# Patient Record
Sex: Male | Born: 1950 | Race: White | Hispanic: No | Marital: Married | Smoking: Never smoker
Health system: Southern US, Community
[De-identification: ages and names within clinical notes are randomized; demographics above are authoritative.]

## PROBLEM LIST (undated history)

## (undated) DIAGNOSIS — E119 Type 2 diabetes mellitus without complications: Secondary | ICD-10-CM

## (undated) DIAGNOSIS — I209 Angina pectoris, unspecified: Secondary | ICD-10-CM

## (undated) DIAGNOSIS — R112 Nausea with vomiting, unspecified: Secondary | ICD-10-CM

## (undated) DIAGNOSIS — Z9889 Other specified postprocedural states: Secondary | ICD-10-CM

## (undated) DIAGNOSIS — M1990 Unspecified osteoarthritis, unspecified site: Secondary | ICD-10-CM

## (undated) DIAGNOSIS — I10 Essential (primary) hypertension: Secondary | ICD-10-CM

## (undated) DIAGNOSIS — R9431 Abnormal electrocardiogram [ECG] [EKG]: Secondary | ICD-10-CM

## (undated) DIAGNOSIS — M109 Gout, unspecified: Secondary | ICD-10-CM

## (undated) HISTORY — DX: Abnormal electrocardiogram (ECG) (EKG): R94.31

## (undated) HISTORY — PX: HAND LIGAMENT RECONSTRUCTION: SHX1726

## (undated) HISTORY — DX: Hereditary hemochromatosis: E83.110

## (undated) HISTORY — PX: PORTACATH PLACEMENT: SHX2246

## (undated) HISTORY — PX: CHOLECYSTECTOMY: SHX55

---

## 2015-08-08 DIAGNOSIS — I10 Essential (primary) hypertension: Secondary | ICD-10-CM | POA: Insufficient documentation

## 2015-08-08 HISTORY — DX: Essential (primary) hypertension: I10

## 2015-09-10 DIAGNOSIS — G4733 Obstructive sleep apnea (adult) (pediatric): Secondary | ICD-10-CM

## 2015-09-10 HISTORY — DX: Obstructive sleep apnea (adult) (pediatric): G47.33

## 2017-09-17 DIAGNOSIS — Z5320 Procedure and treatment not carried out because of patient's decision for unspecified reasons: Secondary | ICD-10-CM

## 2017-09-17 HISTORY — DX: Procedure and treatment not carried out because of patient's decision for unspecified reasons: Z53.20

## 2017-11-26 DIAGNOSIS — Z79899 Other long term (current) drug therapy: Secondary | ICD-10-CM | POA: Insufficient documentation

## 2017-11-26 HISTORY — DX: Other long term (current) drug therapy: Z79.899

## 2017-12-16 DIAGNOSIS — M76822 Posterior tibial tendinitis, left leg: Secondary | ICD-10-CM | POA: Insufficient documentation

## 2017-12-16 DIAGNOSIS — M6702 Short Achilles tendon (acquired), left ankle: Secondary | ICD-10-CM

## 2017-12-16 DIAGNOSIS — M19072 Primary osteoarthritis, left ankle and foot: Secondary | ICD-10-CM

## 2017-12-16 HISTORY — DX: Short Achilles tendon (acquired), left ankle: M67.02

## 2017-12-16 HISTORY — DX: Posterior tibial tendinitis, left leg: M76.822

## 2017-12-16 HISTORY — DX: Primary osteoarthritis, left ankle and foot: M19.072

## 2019-09-07 DIAGNOSIS — M25552 Pain in left hip: Secondary | ICD-10-CM | POA: Insufficient documentation

## 2019-09-07 HISTORY — DX: Pain in left hip: M25.552

## 2019-10-05 NOTE — Patient Instructions (Addendum)
DUE TO COVID-19 ONLY ONE VISITOR IS ALLOWED TO COME WITH YOU AND STAY IN THE WAITING ROOM ONLY DURING PRE OP AND PROCEDURE DAY OF SURGERY. THE 1 VISITOR MAY VISIT WITH YOU AFTER SURGERY IN YOUR PRIVATE ROOM DURING VISITING HOURS ONLY!  YOU NEED TO HAVE A COVID 19 TEST ON: 10/21/19 @  11:00 am , THIS TEST MUST BE DONE BEFORE SURGERY, COME  [place], [address] [place] , [address].  ([place]) ONCE YOUR COVID TEST IS COMPLETED, PLEASE BEGIN THE QUARANTINE INSTRUCTIONS AS OUTLINED IN YOUR HANDOUT.                Terry Bolton   Your procedure is scheduled on: 10/25/19   Report to [facility] Main  Entrance   Report to admitting at: 8:00 AM     Call this number if you have problems the morning of surgery [phone]    Remember:   [place], NO [place].     How to Manage Your Diabetes Before and After Surgery  Why is it important to control my blood sugar before and after surgery? . Improving blood sugar levels before and after surgery helps healing and can limit problems. . A way of improving blood sugar control is eating a healthy diet by: o  Eating less sugar and carbohydrates o  Increasing activity/exercise o  Talking with your doctor about reaching your blood sugar goals . High blood sugars (greater than 180 mg/dL) can raise your risk of infections and slow your recovery, so you will need to focus on controlling your diabetes during the weeks before surgery. . Make sure that the doctor who takes care of your diabetes knows about your planned surgery including the date and location.  How do I manage my blood sugar before surgery? . Check your blood sugar at least 4 times a day, starting 2 days before surgery, to make sure that the level is not too high or low. o Check your blood sugar the morning of your surgery when you wake up and every 2 hours until you get to the Short Stay  unit. . If your blood sugar is less than 70 mg/dL, you will need to treat for low blood sugar: o Do not take insulin. o Treat a low blood sugar (less than 70 mg/dL) with  cup of clear juice (cranberry or apple), 4 glucose tablets, OR glucose gel. o Recheck blood sugar in 15 minutes after treatment (to make sure it is greater than 70 mg/dL). If your blood sugar is not greater than 70 mg/dL on recheck, call [phone] for further instructions. . Report your blood sugar to the short stay nurse when you get to Short Stay.  . If you are admitted to the hospital after surgery: o Your blood sugar will be checked by the staff and you will probably be given insulin after surgery (instead of oral diabetes medicines) to make sure you have good blood sugar levels. o The goal for blood sugar control after surgery is 80-180 mg/dL.   WHAT DO I DO ABOUT MY DIABETES MEDICATION?  Marland Kitchen Do not take oral diabetes medicines (pills) the morning of surgery.  . THE DAY BEFORE SURGERY, take  METFORMIN AS USUAL.       . THE MORNING OF SURGERY, DO NOT TAKE METFORMIN.  DO NOT TAKE ANY DIABETIC MEDICATIONS DAY OF YOUR SURGERY  You may not have any metal on your body including hair pins and              piercings  Do not wear jewelry, lotions, powders or perfumes, deodorant             Men may shave face and neck.   Do not bring valuables to the hospital. [facility] IS NOT             RESPONSIBLE   FOR VALUABLES.  Contacts, dentures or bridgework may not be worn into surgery.  Leave suitcase in the car. After surgery it may be brought to your room.     Patients discharged the day of surgery will not be allowed to drive home. IF YOU ARE HAVING SURGERY AND GOING HOME THE SAME DAY, YOU MUST HAVE AN ADULT TO DRIVE YOU HOME AND BE WITH YOU FOR 24 HOURS. YOU MAY GO HOME BY TAXI OR UBER OR ORTHERWISE, BUT AN ADULT MUST ACCOMPANY YOU HOME AND STAY WITH YOU FOR 24 HOURS.  Name and phone  number of your driver:  Special Instructions: N/A              Please read over the following fact sheets you were given: _____________________________________________________________________           NO SOLID FOOD AFTER MIDNIGHT THE NIGHT PRIOR TO SURGERY. NOTHING BY MOUTH EXCEPT CLEAR LIQUIDS UNTIL: 7:30 am . PLEASE FINISH GATORADE DRINK PER SURGEON ORDER  WHICH NEEDS TO BE COMPLETED AT: 7:30 am .   CLEAR LIQUID DIET   Foods Allowed                                                                     Foods Excluded  Coffee and tea, regular and decaf                             liquids that you cannot  Plain Jell-O any favor except red or purple                                           see through such as: Fruit ices (not with fruit pulp)                                     milk, soups, orange juice  Iced Popsicles                                    All solid food Carbonated beverages, regular and diet                                    Cranberry, grape and apple juices Sports drinks like Gatorade Lightly seasoned clear broth or consume(fat free) Sugar, honey syrup  Sample Menu Breakfast  Lunch                                     Supper Cranberry juice                    Beef broth                            Chicken broth Jell-O                                     Grape juice                           Apple juice Coffee or tea                        Jell-O                                      Popsicle                                                Coffee or tea                        Coffee or tea  _____________________________________________________________________  [facility] - Preparing for Surgery Before surgery, you can play an important role.  Because skin is not sterile, your skin needs to be as free of germs as possible.  You can reduce the number of germs on your skin by washing with CHG (chlorahexidine gluconate) soap before surgery.  CHG  is an antiseptic cleaner which kills germs and bonds with the skin to continue killing germs even after washing. Please DO NOT use if you have an allergy to CHG or antibacterial soaps.  If your skin becomes reddened/irritated stop using the CHG and inform your nurse when you arrive at Short Stay. Do not shave (including legs and underarms) for at least 48 hours prior to the first CHG shower.  You may shave your face/neck. Please follow these instructions carefully:  1.  Shower with CHG Soap the night before surgery and the  morning of Surgery.  2.  If you choose to wash your hair, wash your hair first as usual with your  normal  shampoo.  3.  After you shampoo, rinse your hair and body thoroughly to remove the  shampoo.                           4.  Use CHG as you would any other liquid soap.  You can apply chg directly  to the skin and wash                       Gently with a scrungie or clean washcloth.  5.  Apply the CHG Soap to your body ONLY FROM THE NECK DOWN.   Do not use on face/ open  Wound or open sores. Avoid contact with eyes, ears mouth and genitals (private parts).                       Wash face,  Genitals (private parts) with your normal soap.             6.  Wash thoroughly, paying special attention to the area where your surgery  will be performed.  7.  Thoroughly rinse your body with warm water from the neck down.  8.  DO NOT shower/wash with your normal soap after using and rinsing off  the CHG Soap.                9.  Pat yourself dry with a clean towel.            10.  Wear clean pajamas.            11.  Place clean sheets on your bed the night of your first shower and do not  sleep with pets. Day of Surgery : Do not apply any lotions/deodorants the morning of surgery.  Please wear clean clothes to the hospital/surgery center.  FAILURE TO FOLLOW THESE INSTRUCTIONS MAY RESULT IN THE CANCELLATION OF YOUR SURGERY PATIENT  SIGNATURE_________________________________  NURSE SIGNATURE__________________________________  ________________________________________________________________________   Adam Phenix  An incentive spirometer is a tool that can help keep your lungs clear and active. This tool measures how well you are filling your lungs with each breath. Taking long deep breaths may help reverse or decrease the chance of developing breathing (pulmonary) problems (especially infection) following:  A long period of time when you are unable to move or be active. BEFORE THE PROCEDURE   If the spirometer includes an indicator to show your best effort, your nurse or respiratory therapist will set it to a desired goal.  If possible, sit up straight or lean slightly forward. Try not to slouch.  Hold the incentive spirometer in an upright position. INSTRUCTIONS FOR USE  1. Sit on the edge of your bed if possible, or sit up as far as you can in bed or on a chair. 2. Hold the incentive spirometer in an upright position. 3. Breathe out normally. 4. Place the mouthpiece in your mouth and seal your lips tightly around it. 5. Breathe in slowly and as deeply as possible, raising the piston or the ball toward the top of the column. 6. Hold your breath for 3-5 seconds or for as long as possible. Allow the piston or ball to fall to the bottom of the column. 7. Remove the mouthpiece from your mouth and breathe out normally. 8. Rest for a few seconds and repeat Steps 1 through 7 at least 10 times every 1-2 hours when you are awake. Take your time and take a few normal breaths between deep breaths. 9. The spirometer may include an indicator to show your best effort. Use the indicator as a goal to work toward during each repetition. 10. After each set of 10 deep breaths, practice coughing to be sure your lungs are clear. If you have an incision (the cut made at the time of surgery), support your incision when coughing  by placing a pillow or rolled up towels firmly against it. Once you are able to get out of bed, walk around indoors and cough well. You may stop using the incentive spirometer when instructed by your caregiver.  RISKS AND COMPLICATIONS  Take your time so you do not  get dizzy or light-headed.  If you are in pain, you may need to take or ask for pain medication before doing incentive spirometry. It is harder to take a deep breath if you are having pain. AFTER USE  Rest and breathe slowly and easily.  It can be helpful to keep track of a log of your progress. Your caregiver can provide you with a simple table to help with this. If you are using the spirometer at home, follow these instructions: Clever IF:   You are having difficultly using the spirometer.  You have trouble using the spirometer as often as instructed.  Your pain medication is not giving enough relief while using the spirometer.  You develop fever of 100.5 F (38.1 C) or higher. SEEK IMMEDIATE MEDICAL CARE IF:   You cough up bloody sputum that had not been present before.  You develop fever of 102 F (38.9 C) or greater.  You develop worsening pain at or near the incision site. MAKE SURE YOU:   Understand these instructions.  Will watch your condition.  Will get help right away if you are not doing well or get worse. Document Released: 11/02/2006 Document Revised: 09/14/2011 Document Reviewed: 01/03/2007 ExitCare Patient Information 2014 ExitCare, [place].   ________________________________________________________________________  WHAT IS A BLOOD TRANSFUSION? Blood Transfusion Information  A transfusion is the replacement of blood or some of its parts. Blood is made up of multiple cells which provide different functions.  Red blood cells carry oxygen and are used for blood loss replacement.  White blood cells fight against infection.  Platelets control bleeding.  Plasma helps clot  blood.  Other blood products are available for specialized needs, such as hemophilia or other clotting disorders. BEFORE THE TRANSFUSION  Who gives blood for transfusions?   Healthy volunteers who are fully evaluated to make sure their blood is safe. This is blood bank blood. Transfusion therapy is the safest it has ever been in the practice of medicine. Before blood is taken from a donor, a complete history is taken to make sure that person has no history of diseases nor engages in risky social behavior (examples are intravenous drug use or sexual activity with multiple partners). The donor's travel history is screened to minimize risk of transmitting infections, such as malaria. The donated blood is tested for signs of infectious diseases, such as HIV and hepatitis. The blood is then tested to be sure it is compatible with you in order to minimize the chance of a transfusion reaction. If you or a relative donates blood, this is often done in anticipation of surgery and is not appropriate for emergency situations. It takes many days to process the donated blood. RISKS AND COMPLICATIONS Although transfusion therapy is very safe and saves many lives, the main dangers of transfusion include:   Getting an infectious disease.  Developing a transfusion reaction. This is an allergic reaction to something in the blood you were given. Every precaution is taken to prevent this. The decision to have a blood transfusion has been considered carefully by your caregiver before blood is given. Blood is not given unless the benefits outweigh the risks. AFTER THE TRANSFUSION  Right after receiving a blood transfusion, you will usually feel much better and more energetic. This is especially true if your red blood cells have gotten low (anemic). The transfusion raises the level of the red blood cells which carry oxygen, and this usually causes an energy increase.  The nurse administering the transfusion will  monitor  you carefully for complications. HOME CARE INSTRUCTIONS  No special instructions are needed after a transfusion. You may find your energy is better. Speak with your caregiver about any limitations on activity for underlying diseases you may have. SEEK MEDICAL CARE IF:   Your condition is not improving after your transfusion.  You develop redness or irritation at the intravenous (IV) site. SEEK IMMEDIATE MEDICAL CARE IF:  Any of the following symptoms occur over the next 12 hours:  Shaking chills.  You have a temperature by mouth above 102 F (38.9 C), not controlled by medicine.  Chest, back, or muscle pain.  People around you feel you are not acting correctly or are confused.  Shortness of breath or difficulty breathing.  Dizziness and fainting.  You get a rash or develop hives.  You have a decrease in urine output.  Your urine turns a dark color or changes to pink, red, or brown. Any of the following symptoms occur over the next 10 days:  You have a temperature by mouth above 102 F (38.9 C), not controlled by medicine.  Shortness of breath.  Weakness after normal activity.  The white part of the eye turns yellow (jaundice).  You have a decrease in the amount of urine or are urinating less often.  Your urine turns a dark color or changes to pink, red, or brown. Document Released: 06/19/2000 Document Revised: 09/14/2011 Document Reviewed: 02/06/2008 [facility] Patient Information 2014 [place], [place].  _______________________________________________________________________

## 2019-10-10 NOTE — H&P (Signed)
TOTAL HIP ADMISSION H&P  Patient is admitted for left total hip arthroplasty.  Subjective:  Chief Complaint: Left hip pain  HPI: Terry Bolton, 69 y.o. male, has a history of pain and functional disability in the left hip due to arthritis and patient has failed non-surgical conservative treatments for greater than 12 weeks to include corticosteriod injections and activity modification. Onset of symptoms was gradual, starting 2 years ago with gradually worsening course since that time. The patient noted no past surgery on the left hip. Patient currently rates pain in the left hip at 7 out of 10 with activity. Patient has worsening of pain with activity and weight bearing, pain that interfers with activities of daily living and crepitus. Patient has evidence of  severe end-stage arthritis that is bone-on-bone with large osteophyte formation  by imaging studies. This condition presents safety issues increasing the risk of falls. There is no current active infection.  There are no problems to display for this patient.   History reviewed. No pertinent past medical history.  History reviewed. No pertinent surgical history.  Prior to Admission medications   Medication Sig Start Date End Date Taking? Authorizing Provider  diclofenac (VOLTAREN) 75 MG EC tablet Take 75 mg by mouth 2 (two) times daily.   Yes [provider]  gabapentin (NEURONTIN) 100 MG capsule Take 100 mg by mouth every evening.   Yes [provider]  lisinopril (ZESTRIL) 10 MG tablet Take 10 mg by mouth every evening.   Yes [provider]  MAGNESIUM CITRATE PO Take 250 mg by mouth daily.   Yes [provider]  metFORMIN (GLUCOPHAGE-XR) 500 MG 24 hr tablet Take 500 mg by mouth at bedtime. 10/06/19  Yes [provider]  Methylsulfonylmethane (MSM) 1000 MG CAPS Take 1,000 mg by mouth daily.   Yes [provider]  milk thistle 175 MG tablet Take 175 mg by mouth daily.   Yes  [provider]  Multiple Vitamin (MULTIVITAMIN) capsule Take 1 capsule by mouth daily.   Yes [provider]    No Known Allergies  Social History   Socioeconomic History  . Marital status: Married    Spouse name: Not on file  . Number of children: Not on file  . Years of education: Not on file  . Highest education level: Not on file  Occupational History  . Not on file  Tobacco Use  . Smoking status: Not on file  Substance and Sexual Activity  . Alcohol use: Not on file  . Drug use: Not on file  . Sexual activity: Not on file  Other Topics Concern  . Not on file  Social History Narrative  . Not on file   Social Determinants of Health   Financial Resource Strain:   . Difficulty of Paying Living Expenses:   Food Insecurity:   . Worried About Charity fundraiser in the Last Year:   . Arboriculturist in the Last Year:   Transportation Needs:   . Film/video editor (Medical):   Marland Kitchen Lack of Transportation (Non-Medical):   Physical Activity:   . Days of Exercise per Week:   . Minutes of Exercise per Session:   Stress:   . Feeling of Stress :   Social Connections:   . Frequency of Communication with Friends and Family:   . Frequency of Social Gatherings with Friends and Family:   . Attends Religious Services:   . Active Member of Clubs or Organizations:   .  Attends Banker Meetings:   Marland Kitchen Marital Status:   Intimate Partner Violence:   . Fear of Current or Ex-Partner:   . Emotionally Abused:   Marland Kitchen Physically Abused:   . Sexually Abused:       Tobacco Use:   . Smoking Tobacco Use:   . Smokeless Tobacco Use:    Social History   Substance and Sexual Activity  Alcohol Use None    History reviewed. No pertinent family history.  Review of Systems  Constitutional: Negative for chills and fever.  HENT: Negative for congestion, sore throat and tinnitus.   Eyes: Negative for double vision, photophobia and pain.  Respiratory:  Negative for cough, shortness of breath and wheezing.   Cardiovascular: Negative for chest pain, palpitations and orthopnea.  Gastrointestinal: Negative for heartburn, nausea and vomiting.  Genitourinary: Negative for dysuria, frequency and urgency.  Musculoskeletal: Positive for joint pain.  Neurological: Negative for dizziness, weakness and headaches.     Objective:  Physical Exam: Well nourished and well developed. General: Alert and oriented x3, cooperative and pleasant, no acute distress. Head: normocephalic, atraumatic, neck supple. Eyes: EOMI. Respiratory: breath sounds clear in all fields, no wheezing, rales, or rhonchi. Cardiovascular: Regular rate and rhythm, no murmurs, gallops or rubs. Abdomen: non-tender to palpation and soft, normoactive bowel sounds. Musculoskeletal:  Left Hip Exam: Range of Motion: Flexion to 100 degrees. Minimal internal rotation. External rotation to 20 degrees. Abduction to 20 degrees. There is no tenderness over the greater trochanteric bursa.  Calves soft and nontender. Motor function intact in LE. Strength 5/5 LE bilaterally. Neuro: Distal pulses 2+. Sensation to light touch intact in LE.  Vital signs in last 24 hours: Blood pressure: 152/94 mmHg  Imaging Review Plain radiographs demonstrate severe degenerative joint disease of the left hip. The bone quality appears to be adequate for age and reported activity level.  Assessment/Plan:  End stage arthritis, left hip  The patient history, physical examination, clinical judgement of the provider and imaging studies are consistent with end stage degenerative joint disease of the left hip and total hip arthroplasty is deemed medically necessary. The treatment options including medical management, injection therapy, arthroscopy and arthroplasty were discussed at length. The risks and benefits of total hip arthroplasty were presented and reviewed. The risks due to aseptic loosening, infection,  stiffness, dislocation/subluxation, thromboembolic complications and other imponderables were discussed. The patient acknowledged the explanation, agreed to proceed with the plan and consent was signed. Patient is being admitted for inpatient treatment for surgery, pain control, PT, OT, prophylactic antibiotics, VTE prophylaxis, progressive ambulation and ADLs and discharge planning.The patient is planning to be discharged home.   Patient's anticipated LOS is less than 2 midnights, meeting these requirements: - Younger than 29 - Lives within 1 hour of care - Has a competent adult at home to recover with post-op recover - NO history of  - Chronic pain requiring opiods  - Diabetes  - Coronary Artery Disease  - Heart failure  - Heart attack  - Stroke  - DVT/VTE  - Cardiac arrhythmia  - Respiratory Failure/COPD  - Renal failure  - Anemia  - Advanced Liver disease  Therapy Plans: HEP Disposition: Home with wife Planned DVT Prophylaxis: Aspirin 325 mg BID DME Needed: None PCP: Arlan Organ, MD (clearance received) TXA: IV Allergies: NKDA Anesthesia Concerns: Nausea/vomiting BMI: 27.7 Last HgbA1c: 5.9% Other: Hx hemochromatosis  - Patient was instructed on what medications to stop prior to surgery. - Follow-up visit in 2 weeks with  Dr. Wynelle Link - Begin physical therapy following surgery - Pre-operative lab work as pre-surgical testing - Prescriptions will be provided in hospital at time of discharge  Theresa Duty, PA-C Orthopedic Surgery [facility] [place]

## 2019-10-11 ENCOUNTER — Encounter (HOSPITAL_COMMUNITY): Payer: Self-pay | Admitting: Orthopedic Surgery

## 2019-10-17 ENCOUNTER — Encounter (HOSPITAL_COMMUNITY): Payer: Self-pay

## 2019-10-17 ENCOUNTER — Other Ambulatory Visit: Payer: Self-pay

## 2019-10-17 ENCOUNTER — Encounter (HOSPITAL_COMMUNITY)
Admission: RE | Admit: 2019-10-17 | Discharge: 2019-10-17 | Disposition: A | Discharge status: Home / Self Care | Payer: Medicare PPO | Source: Ambulatory Visit | Attending: Orthopedic Surgery | Admitting: Orthopedic Surgery

## 2019-10-17 DIAGNOSIS — Z79899 Other long term (current) drug therapy: Secondary | ICD-10-CM | POA: Diagnosis not present

## 2019-10-17 DIAGNOSIS — I10 Essential (primary) hypertension: Secondary | ICD-10-CM | POA: Insufficient documentation

## 2019-10-17 DIAGNOSIS — Z7984 Long term (current) use of oral hypoglycemic drugs: Secondary | ICD-10-CM | POA: Insufficient documentation

## 2019-10-17 DIAGNOSIS — Z01812 Encounter for preprocedural laboratory examination: Secondary | ICD-10-CM | POA: Insufficient documentation

## 2019-10-17 DIAGNOSIS — E119 Type 2 diabetes mellitus without complications: Secondary | ICD-10-CM | POA: Diagnosis not present

## 2019-10-17 DIAGNOSIS — M1612 Unilateral primary osteoarthritis, left hip: Secondary | ICD-10-CM | POA: Diagnosis not present

## 2019-10-17 HISTORY — DX: Other specified postprocedural states: Z98.890

## 2019-10-17 HISTORY — DX: Gout, unspecified: M10.9

## 2019-10-17 HISTORY — DX: Type 2 diabetes mellitus without complications: E11.9

## 2019-10-17 HISTORY — DX: Angina pectoris, unspecified: I20.9

## 2019-10-17 HISTORY — DX: Hemochromatosis, unspecified: E83.119

## 2019-10-17 HISTORY — DX: Essential (primary) hypertension: I10

## 2019-10-17 HISTORY — DX: Other specified postprocedural states: R11.2

## 2019-10-17 HISTORY — DX: Unspecified osteoarthritis, unspecified site: M19.90

## 2019-10-17 LAB — COMPREHENSIVE METABOLIC PANEL
ALT: 50 U/L — ABNORMAL HIGH (ref 0–44)
AST: 36 U/L (ref 15–41)
Albumin: 4.3 g/dL (ref 3.5–5.0)
Alkaline Phosphatase: 130 U/L — ABNORMAL HIGH (ref 38–126)
Anion gap: 10 (ref 5–15)
BUN: 24 mg/dL — ABNORMAL HIGH (ref 8–23)
CO2: 24 mmol/L (ref 22–32)
Calcium: 9.2 mg/dL (ref 8.9–10.3)
Chloride: 104 mmol/L (ref 98–111)
Creatinine, Ser: 0.87 mg/dL (ref 0.61–1.24)
GFR calc Af Amer: >60 mL/min (ref >60)
GFR calc non Af Amer: >60 mL/min (ref >60)
Glucose, Bld: 187 mg/dL — ABNORMAL HIGH (ref 70–99)
Potassium: 4.3 mmol/L (ref 3.5–5.1)
Sodium: 138 mmol/L (ref 135–145)
Total Bilirubin: 1 mg/dL (ref 0.3–1.2)
Total Protein: 7 g/dL (ref 6.5–8.1)

## 2019-10-17 LAB — CBC
HCT: 43.2 % (ref 39.0–52.0)
Hemoglobin: 15.4 g/dL (ref 13.0–17.0)
MCH: 34.8 pg — ABNORMAL HIGH (ref 26.0–34.0)
MCHC: 35.6 g/dL (ref 30.0–36.0)
MCV: 97.7 fL (ref 80.0–100.0)
Platelets: 229 10*3/uL (ref 150–400)
RBC: 4.42 MIL/uL (ref 4.22–5.81)
RDW: 11.4 % — ABNORMAL LOW (ref 11.5–15.5)
WBC: 5.1 10*3/uL (ref 4.0–10.5)
nRBC: 0 % (ref 0.0–0.2)

## 2019-10-17 LAB — GLUCOSE, CAPILLARY: Glucose-Capillary: 183 mg/dL — ABNORMAL HIGH (ref 70–99)

## 2019-10-17 LAB — APTT: aPTT: 30 seconds (ref 24–36)

## 2019-10-17 LAB — PROTIME-INR
INR: 1 (ref 0.8–1.2)
Prothrombin Time: 13 seconds (ref 11.4–15.2)

## 2019-10-17 LAB — SURGICAL PCR SCREEN
MRSA, PCR: NEGATIVE
Staphylococcus aureus: NEGATIVE

## 2019-10-17 LAB — ABO/RH: ABO/RH(D): B POS

## 2019-10-17 NOTE — Progress Notes (Signed)
PCP - Dr. Martha Clan. T. LOV: 09/14/19 Cardiologist -   Chest x-ray -  EKG - 09/14/19. CEW Stress Test -  ECHO -  Cardiac Cath -  A1C: 09/13/29. CEW Sleep Study -  CPAP -   Fasting Blood Sugar -  Checks Blood Sugar _____ times a day  Blood Thinner Instructions: Aspirin Instructions: Last Dose:  Anesthesia review:   Patient denies shortness of breath, fever, cough and chest pain at PAT appointment   Patient verbalized understanding of instructions that were given to them at the PAT appointment. Patient was also instructed that they will need to review over the PAT instructions again at home before surgery.

## 2019-10-21 ENCOUNTER — Other Ambulatory Visit (HOSPITAL_COMMUNITY)
Admission: RE | Admit: 2019-10-21 | Discharge: 2019-10-21 | Disposition: A | Discharge status: Home / Self Care | Payer: Medicare PPO | Source: Ambulatory Visit | Attending: Orthopedic Surgery | Admitting: Orthopedic Surgery

## 2019-10-21 DIAGNOSIS — Z20822 Contact with and (suspected) exposure to covid-19: Secondary | ICD-10-CM | POA: Insufficient documentation

## 2019-10-21 DIAGNOSIS — Z01812 Encounter for preprocedural laboratory examination: Secondary | ICD-10-CM | POA: Insufficient documentation

## 2019-10-21 LAB — SARS CORONAVIRUS 2 (TAT 6-24 HRS): SARS Coronavirus 2: NEGATIVE

## 2019-10-24 ENCOUNTER — Encounter (HOSPITAL_COMMUNITY): Payer: Self-pay | Admitting: Orthopedic Surgery

## 2019-10-24 NOTE — Anesthesia Preprocedure Evaluation (Addendum)
Anesthesia Evaluation  Patient identified by MRN, date of birth, ID band Patient awake    Reviewed: Allergy & Precautions, H&P , NPO status , Patient's Chart, lab work & pertinent test results  History of Anesthesia Complications (+) PONV  Airway Mallampati: III  TM Distance: >3 FB Neck ROM: Full    Dental no notable dental hx. (+) Teeth Intact, Dental Advisory Given   Pulmonary neg pulmonary ROS,    Pulmonary exam normal breath sounds clear to auscultation       Cardiovascular Exercise Tolerance: Good hypertension, Pt. on medications  Rhythm:Regular Rate:Normal     Neuro/Psych negative neurological ROS  negative psych ROS   GI/Hepatic negative GI ROS, Neg liver ROS,   Endo/Other  diabetes, Type 2, Oral Hypoglycemic Agents  Renal/GU negative Renal ROS  negative genitourinary   Musculoskeletal  (+) Arthritis , Osteoarthritis,    Abdominal   Peds  Hematology negative hematology ROS (+)   Anesthesia Other Findings   Reproductive/Obstetrics negative OB ROS                            Anesthesia Physical Anesthesia Plan  ASA: II  Anesthesia Plan: MAC and Spinal   Post-op Pain Management:    Induction: Intravenous  PONV Risk Score and Plan: 3 and Ondansetron, Dexamethasone and Propofol infusion  Airway Management Planned: Simple Face Mask  Additional Equipment:   Intra-op Plan:   Post-operative Plan:   Informed Consent: I have reviewed the patients History and Physical, chart, labs and discussed the procedure including the risks, benefits and alternatives for the proposed anesthesia with the patient or authorized representative who has indicated his/her understanding and acceptance.     Dental advisory given  Plan Discussed with: CRNA  Anesthesia Plan Comments:         Anesthesia Quick Evaluation

## 2019-10-25 ENCOUNTER — Ambulatory Visit (HOSPITAL_COMMUNITY): Payer: Medicare PPO | Admitting: Anesthesiology

## 2019-10-25 ENCOUNTER — Ambulatory Visit (HOSPITAL_COMMUNITY): Payer: Medicare PPO

## 2019-10-25 ENCOUNTER — Ambulatory Visit (HOSPITAL_COMMUNITY)
Admission: RE | Admit: 2019-10-25 | Discharge: 2019-10-26 | Disposition: A | Discharge status: Home / Self Care | Payer: Medicare PPO | Source: Ambulatory Visit | Attending: Orthopedic Surgery | Admitting: Orthopedic Surgery

## 2019-10-25 ENCOUNTER — Encounter (HOSPITAL_COMMUNITY)
Admission: RE | Disposition: A | Discharge status: Home / Self Care | Payer: Self-pay | Source: Ambulatory Visit | Attending: Orthopedic Surgery

## 2019-10-25 ENCOUNTER — Other Ambulatory Visit: Payer: Self-pay

## 2019-10-25 ENCOUNTER — Encounter (HOSPITAL_COMMUNITY): Payer: Self-pay | Admitting: Orthopedic Surgery

## 2019-10-25 DIAGNOSIS — E119 Type 2 diabetes mellitus without complications: Secondary | ICD-10-CM | POA: Diagnosis not present

## 2019-10-25 DIAGNOSIS — M1612 Unilateral primary osteoarthritis, left hip: Secondary | ICD-10-CM | POA: Diagnosis present

## 2019-10-25 DIAGNOSIS — M25752 Osteophyte, left hip: Secondary | ICD-10-CM | POA: Diagnosis not present

## 2019-10-25 DIAGNOSIS — Z791 Long term (current) use of non-steroidal anti-inflammatories (NSAID): Secondary | ICD-10-CM | POA: Insufficient documentation

## 2019-10-25 DIAGNOSIS — M24852 Other specific joint derangements of left hip, not elsewhere classified: Secondary | ICD-10-CM | POA: Diagnosis not present

## 2019-10-25 DIAGNOSIS — I10 Essential (primary) hypertension: Secondary | ICD-10-CM | POA: Diagnosis not present

## 2019-10-25 DIAGNOSIS — Z79899 Other long term (current) drug therapy: Secondary | ICD-10-CM | POA: Diagnosis not present

## 2019-10-25 DIAGNOSIS — Z7984 Long term (current) use of oral hypoglycemic drugs: Secondary | ICD-10-CM | POA: Diagnosis not present

## 2019-10-25 DIAGNOSIS — M169 Osteoarthritis of hip, unspecified: Secondary | ICD-10-CM

## 2019-10-25 DIAGNOSIS — N35912 Unspecified bulbous urethral stricture, male: Secondary | ICD-10-CM | POA: Diagnosis not present

## 2019-10-25 DIAGNOSIS — Z96649 Presence of unspecified artificial hip joint: Secondary | ICD-10-CM

## 2019-10-25 DIAGNOSIS — Z419 Encounter for procedure for purposes other than remedying health state, unspecified: Secondary | ICD-10-CM

## 2019-10-25 HISTORY — DX: Osteoarthritis of hip, unspecified: M16.9

## 2019-10-25 HISTORY — PX: TOTAL HIP ARTHROPLASTY: SHX124

## 2019-10-25 LAB — TYPE AND SCREEN
ABO/RH(D): B POS
Antibody Screen: NEGATIVE

## 2019-10-25 LAB — GLUCOSE, CAPILLARY
Glucose-Capillary: 131 mg/dL — ABNORMAL HIGH (ref 70–99)
Glucose-Capillary: 145 mg/dL — ABNORMAL HIGH (ref 70–99)
Glucose-Capillary: 266 mg/dL — ABNORMAL HIGH (ref 70–99)
Glucose-Capillary: 169 mg/dL — ABNORMAL HIGH (ref 70–99)

## 2019-10-25 LAB — HEMOGLOBIN A1C
Hgb A1c MFr Bld: 6.2 % — ABNORMAL HIGH (ref 4.8–5.6)
Mean Plasma Glucose: 131.24 mg/dL

## 2019-10-25 SURGERY — ARTHROPLASTY, HIP, TOTAL, ANTERIOR APPROACH
Anesthesia: Monitor Anesthesia Care | Site: Hip | Laterality: Left

## 2019-10-25 MED ORDER — BISACODYL 10 MG RE SUPP: 10.0000 mg | Freq: Every day | RECTAL | Status: DC | PRN

## 2019-10-25 MED ORDER — ONDANSETRON HCL 4 MG PO TABS: 4.0000 mg | ORAL_TABLET | Freq: Four times a day (QID) | ORAL | Status: DC | PRN

## 2019-10-25 MED ORDER — MIDAZOLAM HCL 2 MG/2ML IJ SOLN
INTRAMUSCULAR | Status: AC
  Filled 2019-10-25: qty 2

## 2019-10-25 MED ORDER — DEXAMETHASONE SODIUM PHOSPHATE 10 MG/ML IJ SOLN
10.0000 mg | Freq: Once | INTRAMUSCULAR | Status: AC
  Administered 2019-10-26: 10 mg via INTRAVENOUS
  Filled 2019-10-25: qty 1

## 2019-10-25 MED ORDER — WATER FOR IRRIGATION, STERILE IR SOLN
Status: DC | PRN
  Administered 2019-10-25: 2000 mL

## 2019-10-25 MED ORDER — METHOCARBAMOL 500 MG IVPB - SIMPLE MED
500.0000 mg | Freq: Four times a day (QID) | INTRAVENOUS | Status: DC | PRN
  Filled 2019-10-25: qty 50

## 2019-10-25 MED ORDER — DOCUSATE SODIUM 100 MG PO CAPS
100.0000 mg | ORAL_CAPSULE | Freq: Two times a day (BID) | ORAL | Status: DC
  Administered 2019-10-25 – 2019-10-26 (×2): 100 mg via ORAL
  Filled 2019-10-25 (×2): qty 1

## 2019-10-25 MED ORDER — HYDROCODONE-ACETAMINOPHEN 5-325 MG PO TABS
1.0000 | ORAL_TABLET | ORAL | Status: DC | PRN
  Administered 2019-10-25: 2 via ORAL
  Administered 2019-10-25: 1 via ORAL
  Filled 2019-10-25: qty 2
  Filled 2019-10-25: qty 1

## 2019-10-25 MED ORDER — GABAPENTIN 100 MG PO CAPS
100.0000 mg | ORAL_CAPSULE | Freq: Every evening | ORAL | Status: DC
  Administered 2019-10-25: 100 mg via ORAL
  Filled 2019-10-25: qty 1

## 2019-10-25 MED ORDER — CHLORHEXIDINE GLUCONATE 4 % EX LIQD: 60.0000 mL | Freq: Once | CUTANEOUS | Status: DC

## 2019-10-25 MED ORDER — ONDANSETRON HCL 4 MG/2ML IJ SOLN
INTRAMUSCULAR | Status: DC | PRN
  Administered 2019-10-25: 4 mg via INTRAVENOUS

## 2019-10-25 MED ORDER — TRANEXAMIC ACID-NACL 1000-0.7 MG/100ML-% IV SOLN
INTRAVENOUS | Status: AC
  Filled 2019-10-25: qty 100

## 2019-10-25 MED ORDER — DEXAMETHASONE SODIUM PHOSPHATE 10 MG/ML IJ SOLN: 8.0000 mg | Freq: Once | INTRAMUSCULAR | Status: DC

## 2019-10-25 MED ORDER — PHENOL 1.4 % MT LIQD: 1.0000 | OROMUCOSAL | Status: DC | PRN

## 2019-10-25 MED ORDER — CEFAZOLIN SODIUM-DEXTROSE 2-4 GM/100ML-% IV SOLN
INTRAVENOUS | Status: AC
  Filled 2019-10-25: qty 100

## 2019-10-25 MED ORDER — ONDANSETRON HCL 4 MG/2ML IJ SOLN: 4.0000 mg | Freq: Four times a day (QID) | INTRAMUSCULAR | Status: DC | PRN

## 2019-10-25 MED ORDER — PHENYLEPHRINE HCL-NACL 10-0.9 MG/250ML-% IV SOLN
INTRAVENOUS | Status: DC | PRN
  Administered 2019-10-25: 25 ug/min via INTRAVENOUS

## 2019-10-25 MED ORDER — PROPOFOL 500 MG/50ML IV EMUL
INTRAVENOUS | Status: DC | PRN
  Administered 2019-10-25: 75 ug/kg/min via INTRAVENOUS

## 2019-10-25 MED ORDER — POVIDONE-IODINE 10 % EX SWAB
2.0000 "application " | Freq: Once | CUTANEOUS | Status: AC
  Administered 2019-10-25: 2 via TOPICAL

## 2019-10-25 MED ORDER — FENTANYL CITRATE (PF) 100 MCG/2ML IJ SOLN
INTRAMUSCULAR | Status: DC | PRN
  Administered 2019-10-25: 50 ug via INTRAVENOUS

## 2019-10-25 MED ORDER — TRANEXAMIC ACID-NACL 1000-0.7 MG/100ML-% IV SOLN
1000.0000 mg | INTRAVENOUS | Status: AC
  Administered 2019-10-25: 10:00:00 1000 mg via INTRAVENOUS

## 2019-10-25 MED ORDER — SODIUM CHLORIDE 0.9 % IV SOLN: INTRAVENOUS | Status: DC

## 2019-10-25 MED ORDER — BUPIVACAINE IN DEXTROSE 0.75-8.25 % IT SOLN
INTRATHECAL | Status: DC | PRN
  Administered 2019-10-25: 1.8 mL via INTRATHECAL

## 2019-10-25 MED ORDER — METOCLOPRAMIDE HCL 5 MG/ML IJ SOLN: 5.0000 mg | Freq: Three times a day (TID) | INTRAMUSCULAR | Status: DC | PRN

## 2019-10-25 MED ORDER — ACETAMINOPHEN 500 MG PO TABS
500.0000 mg | ORAL_TABLET | Freq: Four times a day (QID) | ORAL | Status: DC
  Administered 2019-10-25 – 2019-10-26 (×3): 500 mg via ORAL
  Filled 2019-10-25 (×3): qty 1

## 2019-10-25 MED ORDER — MIDAZOLAM HCL 5 MG/5ML IJ SOLN
INTRAMUSCULAR | Status: DC | PRN
  Administered 2019-10-25: 2 mg via INTRAVENOUS

## 2019-10-25 MED ORDER — BUPIVACAINE HCL 0.25 % IJ SOLN
INTRAMUSCULAR | Status: DC | PRN
  Administered 2019-10-25: 30 mL

## 2019-10-25 MED ORDER — METHOCARBAMOL 500 MG PO TABS
500.0000 mg | ORAL_TABLET | Freq: Four times a day (QID) | ORAL | Status: DC | PRN
  Administered 2019-10-25 – 2019-10-26 (×3): 500 mg via ORAL
  Filled 2019-10-25 (×3): qty 1

## 2019-10-25 MED ORDER — INSULIN ASPART 100 UNIT/ML ~~LOC~~ SOLN
0.0000 [IU] | Freq: Three times a day (TID) | SUBCUTANEOUS | Status: DC
  Administered 2019-10-25: 8 [IU] via SUBCUTANEOUS
  Administered 2019-10-26: 3 [IU] via SUBCUTANEOUS

## 2019-10-25 MED ORDER — TRAMADOL HCL 50 MG PO TABS
50.0000 mg | ORAL_TABLET | Freq: Four times a day (QID) | ORAL | Status: DC | PRN
  Administered 2019-10-25 – 2019-10-26 (×3): 100 mg via ORAL
  Filled 2019-10-25 (×3): qty 2

## 2019-10-25 MED ORDER — CEFAZOLIN SODIUM-DEXTROSE 2-4 GM/100ML-% IV SOLN
2.0000 g | Freq: Four times a day (QID) | INTRAVENOUS | Status: AC
  Administered 2019-10-25 (×2): 2 g via INTRAVENOUS
  Filled 2019-10-25 (×2): qty 100

## 2019-10-25 MED ORDER — MORPHINE SULFATE (PF) 2 MG/ML IV SOLN: 1.0000 mg | INTRAVENOUS | Status: DC | PRN

## 2019-10-25 MED ORDER — ASPIRIN EC 325 MG PO TBEC
325.0000 mg | DELAYED_RELEASE_TABLET | Freq: Two times a day (BID) | ORAL | Status: DC
  Administered 2019-10-26: 10:00:00 325 mg via ORAL
  Filled 2019-10-25: qty 1

## 2019-10-25 MED ORDER — ACETAMINOPHEN 10 MG/ML IV SOLN
INTRAVENOUS | Status: AC
  Filled 2019-10-25: qty 100

## 2019-10-25 MED ORDER — METOCLOPRAMIDE HCL 5 MG PO TABS: 5.0000 mg | ORAL_TABLET | Freq: Three times a day (TID) | ORAL | Status: DC | PRN

## 2019-10-25 MED ORDER — HYDROMORPHONE HCL 1 MG/ML IJ SOLN: 0.2500 mg | INTRAMUSCULAR | Status: DC | PRN

## 2019-10-25 MED ORDER — LIDOCAINE 2% (20 MG/ML) 5 ML SYRINGE
INTRAMUSCULAR | Status: DC | PRN
  Administered 2019-10-25: 50 mg via INTRAVENOUS

## 2019-10-25 MED ORDER — LACTATED RINGERS IV SOLN: INTRAVENOUS | Status: DC

## 2019-10-25 MED ORDER — MENTHOL 3 MG MT LOZG: 1.0000 | LOZENGE | OROMUCOSAL | Status: DC | PRN

## 2019-10-25 MED ORDER — CEFAZOLIN SODIUM-DEXTROSE 2-4 GM/100ML-% IV SOLN
2.0000 g | INTRAVENOUS | Status: AC
  Administered 2019-10-25: 10:00:00 2 g via INTRAVENOUS

## 2019-10-25 MED ORDER — DEXAMETHASONE SODIUM PHOSPHATE 10 MG/ML IJ SOLN
INTRAMUSCULAR | Status: DC | PRN
  Administered 2019-10-25: 8 mg via INTRAVENOUS

## 2019-10-25 MED ORDER — BUPIVACAINE HCL 0.25 % IJ SOLN
INTRAMUSCULAR | Status: AC
  Filled 2019-10-25: qty 1

## 2019-10-25 MED ORDER — ACETAMINOPHEN 10 MG/ML IV SOLN
1000.0000 mg | Freq: Four times a day (QID) | INTRAVENOUS | Status: DC
  Administered 2019-10-25: 11:00:00 1000 mg via INTRAVENOUS

## 2019-10-25 MED ORDER — FENTANYL CITRATE (PF) 100 MCG/2ML IJ SOLN
INTRAMUSCULAR | Status: AC
  Filled 2019-10-25: qty 2

## 2019-10-25 MED ORDER — 0.9 % SODIUM CHLORIDE (POUR BTL) OPTIME
TOPICAL | Status: DC | PRN
  Administered 2019-10-25: 1000 mL

## 2019-10-25 MED ORDER — FLEET ENEMA 7-19 GM/118ML RE ENEM: 1.0000 | ENEMA | Freq: Once | RECTAL | Status: DC | PRN

## 2019-10-25 MED ORDER — POLYETHYLENE GLYCOL 3350 17 G PO PACK: 17.0000 g | PACK | Freq: Every day | ORAL | Status: DC | PRN

## 2019-10-25 MED ORDER — DIPHENHYDRAMINE HCL 12.5 MG/5ML PO ELIX: 12.5000 mg | ORAL_SOLUTION | ORAL | Status: DC | PRN

## 2019-10-25 SURGICAL SUPPLY — 45 items
BAG DECANTER FOR FLEXI CONT (MISCELLANEOUS) IMPLANT
BAG ZIPLOCK 12X15 (MISCELLANEOUS) IMPLANT
BLADE SAG 18X100X1.27 (BLADE) ×3 IMPLANT
CLOSURE WOUND 1/2 X4 (GAUZE/BANDAGES/DRESSINGS) ×1
COVER PERINEAL POST (MISCELLANEOUS) ×3 IMPLANT
COVER SURGICAL LIGHT HANDLE (MISCELLANEOUS) ×3 IMPLANT
COVER WAND RF STERILE (DRAPES) IMPLANT
CUP ACETBLR 54 OD PINNACLE (Hips) ×3 IMPLANT
DECANTER SPIKE VIAL GLASS SM (MISCELLANEOUS) ×3 IMPLANT
DRAPE STERI IOBAN 125X83 (DRAPES) ×3 IMPLANT
DRAPE U-SHAPE 47X51 STRL (DRAPES) ×6 IMPLANT
DRSG ADAPTIC 3X8 NADH LF (GAUZE/BANDAGES/DRESSINGS) ×3 IMPLANT
DRSG AQUACEL AG ADV 3.5X10 (GAUZE/BANDAGES/DRESSINGS) ×3 IMPLANT
DURAPREP 26ML APPLICATOR (WOUND CARE) ×3 IMPLANT
ELECT REM PT RETURN 15FT ADLT (MISCELLANEOUS) ×3 IMPLANT
EVACUATOR 1/8 PVC DRAIN (DRAIN) ×3 IMPLANT
GLOVE BIO SURGEON STRL SZ 6 (GLOVE) IMPLANT
GLOVE BIO SURGEON STRL SZ7 (GLOVE) ×3 IMPLANT
GLOVE BIO SURGEON STRL SZ8 (GLOVE) ×3 IMPLANT
GLOVE BIOGEL PI IND STRL 6.5 (GLOVE) IMPLANT
GLOVE BIOGEL PI IND STRL 7.0 (GLOVE) ×1 IMPLANT
GLOVE BIOGEL PI IND STRL 8 (GLOVE) ×1 IMPLANT
GLOVE BIOGEL PI INDICATOR 6.5 (GLOVE)
GLOVE BIOGEL PI INDICATOR 7.0 (GLOVE) ×2
GLOVE BIOGEL PI INDICATOR 8 (GLOVE) ×2
GOWN STRL REUS W/TWL LRG LVL3 (GOWN DISPOSABLE) ×3 IMPLANT
GOWN STRL REUS W/TWL XL LVL3 (GOWN DISPOSABLE) IMPLANT
HEAD CERAMIC 36 PLUS5 (Hips) ×3 IMPLANT
HOLDER FOLEY CATH W/STRAP (MISCELLANEOUS) ×3 IMPLANT
KIT TURNOVER KIT A (KITS) IMPLANT
LINER MARATHON NEUT +4X54X36 (Hips) ×3 IMPLANT
MANIFOLD NEPTUNE II (INSTRUMENTS) ×3 IMPLANT
PACK ANTERIOR HIP CUSTOM (KITS) ×3 IMPLANT
PENCIL SMOKE EVACUATOR COATED (MISCELLANEOUS) ×3 IMPLANT
STEM FEM ACTIS HIGH SZ10 (Stem) ×3 IMPLANT
STRIP CLOSURE SKIN 1/2X4 (GAUZE/BANDAGES/DRESSINGS) ×2 IMPLANT
SUT ETHIBOND NAB CT1 #1 30IN (SUTURE) ×3 IMPLANT
SUT MNCRL AB 4-0 PS2 18 (SUTURE) ×3 IMPLANT
SUT STRATAFIX 0 PDS 27 VIOLET (SUTURE) ×3
SUT VIC AB 2-0 CT1 27 (SUTURE) ×4
SUT VIC AB 2-0 CT1 TAPERPNT 27 (SUTURE) ×2 IMPLANT
SUTURE STRATFX 0 PDS 27 VIOLET (SUTURE) ×1 IMPLANT
SYR 50ML LL SCALE MARK (SYRINGE) IMPLANT
TRAY FOLEY MTR SLVR 16FR STAT (SET/KITS/TRAYS/PACK) ×6 IMPLANT
YANKAUER SUCT BULB TIP 10FT TU (MISCELLANEOUS) ×3 IMPLANT

## 2019-10-25 NOTE — Discharge Instructions (Addendum)
Frank Aluisio, MD Total Joint Specialist [facility] [address] [place], [place] [address] [phone]  ANTERIOR APPROACH TOTAL HIP REPLACEMENT POSTOPERATIVE DIRECTIONS     Hip Rehabilitation, Guidelines Following Surgery  The results of a hip operation are greatly improved after range of motion and muscle strengthening exercises. Follow all safety measures which are given to protect your hip. If any of these exercises cause increased pain or swelling in your joint, decrease the amount until you are comfortable again. Then slowly increase the exercises. Call your caregiver if you have problems or questions.   BLOOD CLOT PREVENTION . Take a 325 mg Aspirin two times a day for three weeks following surgery. Then take an 81 mg Aspirin once a day for three weeks. Then discontinue Aspirin. . You may resume your vitamins/supplements upon discharge from the hospital. . Do not take any NSAIDs (Advil, Aleve, Ibuprofen, Meloxicam, etc.) until you have discontinued the 325 mg Aspirin.  HOME CARE INSTRUCTIONS  . Remove items at home which could result in a fall. This includes throw rugs or furniture in walking pathways.   ICE to the affected hip as frequently as 20-30 minutes an hour and then as needed for pain and swelling. Continue to use ice on the hip for pain and swelling from surgery. You may notice swelling that will progress down to the foot and ankle. This is normal after surgery. Elevate the leg when you are not up walking on it.    Continue to use the breathing machine which will help keep your temperature down.  It is common for your temperature to cycle up and down following surgery, especially at night when you are not up moving around and exerting yourself.  The breathing machine keeps your lungs expanded and your temperature down.  DIET You may resume your previous home diet once your are discharged from the hospital.  DRESSING / WOUND CARE /  SHOWERING . You have an adhesive waterproof bandage over the incision. Leave this in place until your first follow-up appointment. Once you remove this you will not need to place another bandage.  . You may begin showering 3 days following surgery, but do not submerge the incision under water.  ACTIVITY . For the first 3-5 days, it is important to rest and keep the operative leg elevated. You should, as a general rule, rest for 50 minutes and walk/stretch for 10 minutes per hour. After 5 days, you may slowly increase activity as tolerated.  . Perform the exercises you were provided twice a day for about 15-20 minutes each session. Begin these 2 days following surgery. . Walk with your walker as instructed. Use the walker until you are comfortable transitioning to a cane. Walk with the cane in the opposite hand of the operative leg. You may discontinue the cane once you are comfortable and walking steadily. . Avoid periods of inactivity such as sitting longer than an hour when not asleep. This helps prevent blood clots.  . Do not drive a car for 6 weeks or until released by your surgeon.  . Do not drive while taking narcotics.  TED HOSE STOCKINGS Wear the elastic stockings on both legs for three weeks following surgery during the day. You may remove them at night while sleeping.  WEIGHT BEARING Weight bearing as tolerated with assist device (walker, cane, etc) as directed, use it as long as suggested by your surgeon or therapist, typically at least 4-6 weeks.  POSTOPERATIVE CONSTIPATION PROTOCOL Constipation -   defined medically as fewer than three stools per week and severe constipation as less than one stool per week.  One of the most common issues patients have following surgery is constipation.  Even if you have a regular bowel pattern at home, your normal regimen is likely to be disrupted due to multiple reasons following surgery.  Combination of anesthesia, postoperative narcotics, change in  appetite and fluid intake all can affect your bowels.  In order to avoid complications following surgery, here are some recommendations in order to help you during your recovery period.  . Colace (docusate) - Pick up an over-the-counter form of Colace or another stool softener and take twice a day as long as you are requiring postoperative pain medications.  Take with a full glass of water daily.  If you experience loose stools or diarrhea, hold the colace until you stool forms back up.  If your symptoms do not get better within 1 week or if they get worse, check with your doctor. . Dulcolax (bisacodyl) - Pick up over-the-counter and take as directed by the product packaging as needed to assist with the movement of your bowels.  Take with a full glass of water.  Use this product as needed if not relieved by Colace only.  . MiraLax (polyethylene glycol) - Pick up over-the-counter to have on hand.  MiraLax is a solution that will increase the amount of water in your bowels to assist with bowel movements.  Take as directed and can mix with a glass of water, juice, soda, coffee, or tea.  Take if you go more than two days without a movement.Do not use MiraLax more than once per day. Call your doctor if you are still constipated or irregular after using this medication for 7 days in a row.  If you continue to have problems with postoperative constipation, please contact the office for further assistance and recommendations.  If you experience "the worst abdominal pain ever" or develop nausea or vomiting, please contact the office immediatly for further recommendations for treatment.  ITCHING  If you experience itching with your medications, try taking only a single pain pill, or even half a pain pill at a time.  You can also use Benadryl over the counter for itching or also to help with sleep.   MEDICATIONS See your medication summary on the "After Visit Summary" that the nursing staff will review with you  prior to discharge.  You may have some home medications which will be placed on hold until you complete the course of blood thinner medication.  It is important for you to complete the blood thinner medication as prescribed by your surgeon.  Continue your approved medications as instructed at time of discharge.  PRECAUTIONS If you experience chest pain or shortness of breath - call 911 immediately for transfer to the hospital emergency department.  If you develop a fever greater that 101 F, purulent drainage from wound, increased redness or drainage from wound, foul odor from the wound/dressing, or calf pain - CONTACT YOUR SURGEON.                                                   FOLLOW-UP APPOINTMENTS Make sure you keep all of your appointments after your operation with your surgeon and caregivers. You should call the office at the above phone number and   make an appointment for approximately two weeks after the date of your surgery or on the date instructed by your surgeon outlined in the "After Visit Summary".  RANGE OF MOTION AND STRENGTHENING EXERCISES  These exercises are designed to help you keep full movement of your hip joint. Follow your caregiver's or physical therapist's instructions. Perform all exercises about fifteen times, three times per day or as directed. Exercise both hips, even if you have had only one joint replacement. These exercises can be done on a training (exercise) mat, on the floor, on a table or on a bed. Use whatever works the best and is most comfortable for you. Use music or television while you are exercising so that the exercises are a pleasant break in your day. This will make your life better with the exercises acting as a break in routine you can look forward to.  . Lying on your back, slowly slide your foot toward your buttocks, raising your knee up off the floor. Then slowly slide your foot back down until your leg is straight again.  . Lying on your back spread  your legs as far apart as you can without causing discomfort.  . Lying on your side, raise your upper leg and foot straight up from the floor as far as is comfortable. Slowly lower the leg and repeat.  . Lying on your back, tighten up the muscle in the front of your thigh (quadriceps muscles). You can do this by keeping your leg straight and trying to raise your heel off the floor. This helps strengthen the largest muscle supporting your knee.  . Lying on your back, tighten up the muscles of your buttocks both with the legs straight and with the knee bent at a comfortable angle while keeping your heel on the floor.   IF YOU ARE TRANSFERRED TO A SKILLED REHAB FACILITY If the patient is transferred to a skilled rehab facility following release from the hospital, a list of the current medications will be sent to the facility for the patient to continue.  When discharged from the skilled rehab facility, please have the facility set up the patient's Home Health Physical Therapy prior to being released. Also, the skilled facility will be responsible for providing the patient with their medications at time of release from the facility to include their pain medication, the muscle relaxants, and their blood thinner medication. If the patient is still at the rehab facility at time of the two week follow up appointment, the skilled rehab facility will also need to assist the patient in arranging follow up appointment in our office and any transportation needs.  MAKE SURE YOU:  . Understand these instructions.  . Get help right away if you are not doing well or get worse.    DENTAL ANTIBIOTICS:  In most cases prophylactic antibiotics for Dental procdeures after total joint surgery are not necessary.  Exceptions are as follows:  1. History of prior total joint infection  2. Severely immunocompromised (Organ Transplant, cancer chemotherapy, Rheumatoid biologic meds such as Humera)  3. Poorly controlled  diabetes (A1C &gt; 8.0, blood glucose over 200)  If you have one of these conditions, contact your surgeon for an antibiotic prescription, prior to your dental procedure.    Pick up stool softner and laxative for home use following surgery while on pain medications. Do not submerge incision under water. Please use good hand washing techniques while changing dressing each day. May shower starting three days after surgery. Please   use a clean towel to pat the incision dry following showers. Continue to use ice for pain and swelling after surgery. Do not use any lotions or creams on the incision until instructed by your surgeon.  

## 2019-10-25 NOTE — Evaluation (Signed)
Physical Therapy Evaluation Patient Details Name: Terry Bolton MRN: [id number] DOB: 1950-07-30 Today's Date: 10/25/2019   History of Present Illness  Patient is 69 y.o. male s/p Lt THA on 10/25/19 with PMH significant for HTN, Gout, DM, OA, anginal pain.  Clinical Impression  Terry Bolton is a 69 y.o. male POD 0 s/p Lt THA. Patient reports independence with mobility at baseline. Patient is now limited by functional impairments (see PT problem list below) and requires min-mod assist for transfers and gait with RW. Patient was able to ambulate ~10 feet with RW and min assist. Gait distance limited due to proximal weakness at hips. Patient instructed in exercise to facilitate ROM and circulation. Patient will benefit from continued skilled PT interventions to address impairments and progress towards PLOF. Acute PT will follow to progress mobility and stair training in preparation for safe discharge home.     Follow Up Recommendations Follow surgeon's recommendation for DC plan and follow-up therapies    Equipment Recommendations  3in1 (PT)    Recommendations for Other Services       Precautions / Restrictions Precautions Precautions: Fall Restrictions Weight Bearing Restrictions: No Other Position/Activity Restrictions: WBAT      Mobility  Bed Mobility Overal bed mobility: Needs Assistance Bed Mobility: Supine to Sit     Supine to sit: Min assist;HOB elevated     General bed mobility comments: cues for use of bed rail and assist required to mobilize Lt LE and raise trunk.  Transfers Overall transfer level: Needs assistance Equipment used: Rolling walker (2 wheeled) Transfers: Sit to/from [facility] Sit to Stand: From elevated surface;Min assist;+2 safety/equipment Stand pivot transfers: Mod assist;+2 safety/equipment       General transfer comment: cues for technique with RW. assist required to power up and to steady with rising. pt with NBOS upon  standing and assist required to steady and cues to increase width of stance. Patient required min-mod assist to steady and cues to sequence side steps to complete stand step transfer to recliner.   Ambulation/Gait Ambulation/Gait assistance: Min assist Gait Distance (Feet): 10 Feet Assistive device: Rolling walker (2 wheeled) Gait Pattern/deviations: Step-to pattern;Decreased stride length;Narrow base of support;Scissoring Gait velocity: decreased   General Gait Details: pt initially with NBOS in standing and cues required for increasing stance width by verbal/tactile cues to abduct Rt hip. Pt able to do so and performed short forward gait in room with min assist to steady and manage RW.   Stairs       Wheelchair Mobility    Modified Rankin (Stroke Patients Only)       Balance Overall balance assessment: Needs assistance Sitting-balance support: Feet supported Sitting balance-Leahy Scale: Good     Standing balance support: During functional activity;Bilateral upper extremity supported Standing balance-Leahy Scale: Poor          Pertinent Vitals/Pain Pain Assessment: 0-10 Pain Score: 4  Pain Location: Lt hip Pain Descriptors / Indicators: Discomfort Pain Intervention(s): Limited activity within patient's tolerance;Monitored during session;Repositioned;Ice applied    Home Living Family/patient expects to be discharged to:: Private residence Living Arrangements: Spouse/significant other Available Help at Discharge: Family Type of Home: House Home Access: Stairs to enter Entrance Stairs-Rails: None Entrance Stairs-Number of Steps: 1 Home Layout: One level Home Equipment: Cane - single point;Walker - 2 wheels      Prior Function Level of Independence: Independent               Hand Dominance   Dominant Hand:  Left    Extremity/Trunk Assessment   Upper Extremity Assessment Upper Extremity Assessment: Overall WFL for tasks assessed    Lower Extremity  Assessment Lower Extremity Assessment: Overall WFL for tasks assessed(pt reports some paresthesia in bil feet and buttock)    Cervical / Trunk Assessment Cervical / Trunk Assessment: Normal  Communication   Communication: No difficulties  Cognition Arousal/Alertness: Awake/alert Behavior During Therapy: WFL for tasks assessed/performed Overall Cognitive Status: Within Functional Limits for tasks assessed           General Comments      Exercises Total Joint Exercises Ankle Circles/Pumps: AROM;Both;20 reps;Seated Quad Sets: AROM;Left;Seated;10 reps Heel Slides: AROM;Left;10 reps;AAROM;Seated   Assessment/Plan    PT Assessment Patient needs continued PT services  PT Problem List Decreased strength;Decreased range of motion;Decreased activity tolerance;Decreased balance;Decreased mobility;Decreased knowledge of use of DME;Decreased knowledge of precautions       PT Treatment Interventions DME instruction;Gait training;Stair training;Functional mobility training;Therapeutic activities;Therapeutic exercise;Balance training;Patient/family education    PT Goals (Current goals can be found in the Care Plan section)  Acute Rehab PT Goals Patient Stated Goal: to get back into garden PT Goal Formulation: With patient Time For Goal Achievement: 11/01/19 Potential to Achieve Goals: Good    Frequency 7X/week    AM-PAC PT "6 Clicks" Mobility  Outcome Measure Help needed turning from your back to your side while in a flat bed without using bedrails?: A Little Help needed moving from lying on your back to sitting on the side of a flat bed without using bedrails?: A Little Help needed moving to and from a bed to a chair (including a wheelchair)?: A Lot Help needed standing up from a chair using your arms (e.g., wheelchair or bedside chair)?: A Little Help needed to walk in hospital room?: A Little Help needed climbing 3-5 steps with a railing? : A Lot 6 Click Score: 16    End of  Session Equipment Utilized During Treatment: Gait belt Activity Tolerance: Patient tolerated treatment well Patient left: in chair;with call bell/phone within reach;with chair alarm set;with family/visitor present Nurse Communication: Mobility status PT Visit Diagnosis: Muscle weakness (generalized) (M62.81);Difficulty in walking, not elsewhere classified (R26.2)    Time: 5188-4166 PT Time Calculation (min) (ACUTE ONLY): 23 min   Charges:   PT Evaluation $PT Eval Low Complexity: 1 Low PT Treatments $Therapeutic Exercise: 8-22 mins        Wynn Maudlin, DPT Physical Therapist with [facility] [phone]  10/25/2019 3:44 PM

## 2019-10-25 NOTE — Transfer of Care (Signed)
Immediate Anesthesia Transfer of Care Note  Patient: Terry Bolton  Procedure(s) Performed: TOTAL HIP ARTHROPLASTY ANTERIOR APPROACH (Left Hip)  Patient Location: PACU  Anesthesia Type:Spinal  Level of Consciousness: sedated  Airway & Oxygen Therapy: Patient Spontanous Breathing and Patient connected to face mask oxygen  Post-op Assessment: Report given to RN and Post -op Vital signs reviewed and stable  Post vital signs: Reviewed and stable  Last Vitals:  Vitals Value Taken Time  BP 86/60 10/25/19 1146  Temp    Pulse 57 10/25/19 1148  Resp 11 10/25/19 1148  SpO2 99 % 10/25/19 1148  Vitals shown include unvalidated device data.  Last Pain:  Vitals:   10/25/19 0820  TempSrc: Oral         Complications: No apparent anesthesia complications

## 2019-10-25 NOTE — Op Note (Signed)
Preoperative diagnosis:  1. Difficult Foley catheterization  Postoperative diagnosis:  1. Same  Procedure: 1. Cystoscopy, difficult Foley catheter placement  Surgeon: Crist Fat, MD  Anesthesia: General  Complications: None  Intraoperative findings: The patient had a small soft bulbar urethral stricture with evidence of a small false passage just on the posterior aspect of the urethra.  I was able to advance the flexible cystoscope through the strictured area without any difficulty.  A 16 French Foley catheter was then advanced over the wire and clear yellow urine obtained.  EBL: Minimal  Specimens: None  Indication: Terry Bolton is a 69 y.o. patient with osteoarthritis of his left hip.  As part of the procedure a Foley catheter was attempted a few times by the nursing staff prior to consulting urology for further assistance.   Description of procedure:  After the patient's surgery had been completed for his left total hip he was moved off of the operating room table and onto the hospital bed.  I then prepped and draped the genitals in the routine fashion sterile fashion.  I advanced flexible cystoscope to the patient's urethra noting the above findings.  I then advanced a wire through the scope and slowly remove the scope over the wire.  I then used a 16-gauge Angiocath to facilitate passing the wire through the tip of the 16 French catheter.  I then advanced the catheter into the patient's urethra and ultimately into the bladder over the wire.  Once urine was obtained I remove the wire and advanced the catheter slightly further before instilling 10 cc of sterile water into the balloon.  Disposition: There was only a very mild urethral trauma noted and a soft bulbar urethral stricture.  As such, I do not see any reason to leave the Foley catheter any longer than necessary.  The patient should not require any additional follow-up from urology.  Crist Fat,  M.D.

## 2019-10-25 NOTE — Anesthesia Postprocedure Evaluation (Signed)
Anesthesia Post Note  Patient: Terry Bolton  Procedure(s) Performed: TOTAL HIP ARTHROPLASTY ANTERIOR APPROACH; CYSTOSCOPY WITH URETHRAL CATHETER INSERTION (Left Hip)     Patient location during evaluation: PACU Anesthesia Type: MAC and Spinal Level of consciousness: oriented and awake and alert Pain management: pain level controlled Vital Signs Assessment: post-procedure vital signs reviewed and stable Respiratory status: spontaneous breathing, respiratory function stable and patient connected to nasal cannula oxygen Cardiovascular status: blood pressure returned to baseline and stable Postop Assessment: no headache, no backache, no apparent nausea or vomiting, spinal receding and patient able to bend at knees Anesthetic complications: no    Last Vitals:  Vitals:   10/25/19 1245 10/25/19 1300  BP: 139/74 131/76  Pulse: (!) 47 (!) 50  Resp: 11 11  Temp:  (!) 36.3 C  SpO2: 100% 100%    Last Pain:  Vitals:   10/25/19 1245  TempSrc:   PainSc: 0-No pain                 Kaimana Neuzil,W. EDMOND

## 2019-10-25 NOTE — Anesthesia Procedure Notes (Signed)
Spinal  Patient location during procedure: OR Start time: 10/25/2019 10:00 AM End time: 10/25/2019 10:02 AM Staffing Performed: anesthesiologist  Anesthesiologist: Gaynelle Adu, MD Preanesthetic Checklist Completed: patient identified, IV checked, risks and benefits discussed, surgical consent, monitors and equipment checked, pre-op evaluation and timeout performed Spinal Block Patient position: sitting Prep: DuraPrep Patient monitoring: cardiac monitor, continuous pulse ox and blood pressure Approach: midline Location: L3-4 Injection technique: single-shot Needle Needle type: Pencan  Needle gauge: 24 G Needle length: 9 cm Assessment Sensory level: T8 Additional Notes Functioning IV was confirmed and monitors were applied. Sterile prep and drape, including hand hygiene and sterile gloves were used. The patient was positioned and the spine was prepped. The skin was anesthetized with lidocaine.  Free flow of clear CSF was obtained prior to injecting local anesthetic into the CSF.  The spinal needle aspirated freely following injection.  The needle was carefully withdrawn.  The patient tolerated the procedure well.

## 2019-10-25 NOTE — Op Note (Signed)
OPERATIVE REPORT- TOTAL HIP ARTHROPLASTY   PREOPERATIVE DIAGNOSIS: Osteoarthritis of the Left hip.   POSTOPERATIVE DIAGNOSIS: Osteoarthritis of the Left  hip.   PROCEDURE: Left total hip arthroplasty, anterior approach.   SURGEON: Ollen Gross, MD   ASSISTANT: Arther Abbott, PA-C  ANESTHESIA:  Spinal  ESTIMATED BLOOD LOSS:-200 mL    DRAINS: Hemovac x1.   COMPLICATIONS: None   CONDITION: PACU - hemodynamically stable.   BRIEF CLINICAL NOTE: Erubiel Manasco is a 69 y.o. male who has advanced end-  stage arthritis of their Left  hip with progressively worsening pain and  dysfunction.The patient has failed nonoperative management and presents for  total hip arthroplasty.   PROCEDURE IN DETAIL: After successful administration of spinal  anesthetic, the traction boots for the [facility] bed were placed on both  feet and the patient was placed onto the [facility] bed, boots placed into the leg  holders. The Left hip was then isolated from the perineum with plastic  drapes and prepped and draped in the usual sterile fashion. ASIS and  greater trochanter were marked and a oblique incision was made, starting  at about 1 cm lateral and 2 cm distal to the ASIS and coursing towards  the anterior cortex of the femur. The skin was cut with a 10 blade  through subcutaneous tissue to the level of the fascia overlying the  tensor fascia lata muscle. The fascia was then incised in line with the  incision at the junction of the anterior third and posterior 2/3rd. The  muscle was teased off the fascia and then the interval between the TFL  and the rectus was developed. The Hohmann retractor was then placed at  the top of the femoral neck over the capsule. The vessels overlying the  capsule were cauterized and the fat on top of the capsule was removed.  A Hohmann retractor was then placed anterior underneath the rectus  femoris to give exposure to the entire anterior capsule. A T-shaped   capsulotomy was performed. The edges were tagged and the femoral head  was identified.       Osteophytes are removed off the superior acetabulum.  The femoral neck was then cut in situ with an oscillating saw. Traction  was then applied to the left lower extremity utilizing the [facility]  traction. The femoral head was then removed. Retractors were placed  around the acetabulum and then circumferential removal of the labrum was  performed. Osteophytes were also removed. Reaming starts at 51 mm to  medialize and  Increased in 2 mm increments to 53 mm. We reamed in  approximately 40 degrees of abduction, 20 degrees anteversion. A 54 mm  pinnacle acetabular shell was then impacted in anatomic position under  fluoroscopic guidance with excellent purchase. We did not need to place  any additional dome screws. A 36 mm neutral + 4 marathon liner was then  placed into the acetabular shell.       The femoral lift was then placed along the lateral aspect of the femur  just distal to the vastus ridge. The leg was  externally rotated and capsule  was stripped off the inferior aspect of the femoral neck down to the  level of the lesser trochanter, this was done with electrocautery. The femur was lifted after this was performed. The  leg was then placed in an extended and adducted position essentially delivering the femur. We also removed the capsule superiorly and the piriformis from the piriformis  fossa to gain excellent exposure of the  proximal femur. Rongeur was used to remove some cancellous bone to get  into the lateral portion of the proximal femur for placement of the  initial starter reamer. The starter broaches was placed  the starter broach  and was shown to go down the center of the canal. Broaching  with the Actis system was then performed starting at size 0  coursing  Up to size 10. A size 10 had excellent torsional and rotational  and axial stability. The trial high offset neck was then placed   with a 36 + 5 trial head. The hip was then reduced. We confirmed that  the stem was in the canal both on AP and lateral x-rays. It also has excellent sizing. The hip was reduced with outstanding stability through full extension and full external rotation.. AP pelvis was taken and the leg lengths were measured and found to be equal. Hip was then dislocated again and the femoral head and neck removed. The  femoral broach was removed. Size 10 Actis stem with a high offset  neck was then impacted into the femur following native anteversion. Has  excellent purchase in the canal. Excellent torsional and rotational and  axial stability. It is confirmed to be in the canal on AP and lateral  fluoroscopic views. The 36 + 5 ceramic head was placed and the hip  reduced with outstanding stability. Again AP pelvis was taken and it  confirmed that the leg lengths were equal. The wound was then copiously  irrigated with saline solution and the capsule reattached and repaired  with Ethibond suture. 30 ml of .25% Bupivicaine was  injected into the capsule and into the edge of the tensor fascia lata as well as subcutaneous tissue. The fascia overlying the tensor fascia lata was then closed with a running #1 V-Loc. Subcu was closed with interrupted 2-0 Vicryl and subcuticular running 4-0 Monocryl. Incision was cleaned  and dried. Steri-Strips and a bulky sterile dressing applied. Hemovac  drain was hooked to suction and then the patient was awakened and transported to  recovery in stable condition.        Please note that a surgical assistant was a medical necessity for this procedure to perform it in a safe and expeditious manner. Assistant was necessary to provide appropriate retraction of vital neurovascular structures and to prevent femoral fracture and allow for anatomic placement of the prosthesis.  Gaynelle Arabian, M.D.

## 2019-10-25 NOTE — Interval H&P Note (Signed)
History and Physical Interval Note:  10/25/2019 8:02 AM  Terry Bolton  has presented today for surgery, with the diagnosis of left hip osteoarthritis.  The various methods of treatment have been discussed with the patient and family. After consideration of risks, benefits and other options for treatment, the patient has consented to  Procedure(s) with comments: TOTAL HIP ARTHROPLASTY ANTERIOR APPROACH (Left) - as a surgical intervention.  The patient's history has been reviewed, patient examined, no change in status, stable for surgery.  I have reviewed the patient's chart and labs.  Questions were answered to the patient's satisfaction.     Homero Fellers Carmisha Larusso

## 2019-10-26 ENCOUNTER — Encounter: Payer: Self-pay | Admitting: *Deleted

## 2019-10-26 DIAGNOSIS — M1612 Unilateral primary osteoarthritis, left hip: Secondary | ICD-10-CM | POA: Diagnosis not present

## 2019-10-26 LAB — CBC
HCT: 37.8 % — ABNORMAL LOW (ref 39.0–52.0)
Hemoglobin: 13.4 g/dL (ref 13.0–17.0)
MCH: 34.3 pg — ABNORMAL HIGH (ref 26.0–34.0)
MCHC: 35.4 g/dL (ref 30.0–36.0)
MCV: 96.7 fL (ref 80.0–100.0)
Platelets: 202 10*3/uL (ref 150–400)
RBC: 3.91 MIL/uL — ABNORMAL LOW (ref 4.22–5.81)
RDW: 11.4 % — ABNORMAL LOW (ref 11.5–15.5)
WBC: 11.4 10*3/uL — ABNORMAL HIGH (ref 4.0–10.5)
nRBC: 0 % (ref 0.0–0.2)

## 2019-10-26 LAB — BASIC METABOLIC PANEL
Anion gap: 11 (ref 5–15)
BUN: 14 mg/dL (ref 8–23)
CO2: 22 mmol/L (ref 22–32)
Calcium: 8.4 mg/dL — ABNORMAL LOW (ref 8.9–10.3)
Chloride: 99 mmol/L (ref 98–111)
Creatinine, Ser: 0.88 mg/dL (ref 0.61–1.24)
GFR calc Af Amer: >60 mL/min (ref >60)
GFR calc non Af Amer: >60 mL/min (ref >60)
Glucose, Bld: 164 mg/dL — ABNORMAL HIGH (ref 70–99)
Potassium: 3.9 mmol/L (ref 3.5–5.1)
Sodium: 132 mmol/L — ABNORMAL LOW (ref 135–145)

## 2019-10-26 LAB — GLUCOSE, CAPILLARY: Glucose-Capillary: 167 mg/dL — ABNORMAL HIGH (ref 70–99)

## 2019-10-26 MED ORDER — ASPIRIN 325 MG PO TBEC: 325.0000 mg | DELAYED_RELEASE_TABLET | Freq: Two times a day (BID) | ORAL | 0 refills | Status: AC

## 2019-10-26 MED ORDER — METHOCARBAMOL 500 MG PO TABS: 500.0000 mg | ORAL_TABLET | Freq: Four times a day (QID) | ORAL | 0 refills | Status: DC | PRN

## 2019-10-26 MED ORDER — TRAMADOL HCL 50 MG PO TABS: 50.0000 mg | ORAL_TABLET | Freq: Four times a day (QID) | ORAL | 0 refills | Status: DC | PRN

## 2019-10-26 MED ORDER — HYDROCODONE-ACETAMINOPHEN 5-325 MG PO TABS: 1.0000 | ORAL_TABLET | Freq: Four times a day (QID) | ORAL | 0 refills | Status: DC | PRN

## 2019-10-26 NOTE — Progress Notes (Signed)
Physical Therapy Treatment Patient Details Name: Terry Bolton MRN: [id number] DOB: 12-24-1950 Today's Date: 10/26/2019    History of Present Illness Patient is 69 y.o. male s/p Lt THA on 10/25/19 with PMH significant for HTN, Gout, DM, OA, anginal pain.    PT Comments    Pt motivated and progressing well with mobility - no c/o dizziness this session.  Family present to review car transfers, stairs and HEP with written instruction provided.  Pt and family eager for dc home.  Follow Up Recommendations  Follow surgeon's recommendation for DC plan and follow-up therapies     Equipment Recommendations  3in1 (PT)    Recommendations for Other Services       Precautions / Restrictions Precautions Precautions: Fall Restrictions Weight Bearing Restrictions: No Other Position/Activity Restrictions: WBAT    Mobility  Bed Mobility Overal bed mobility: Needs Assistance Bed Mobility: Supine to Sit;Sit to Supine     Supine to sit: Min guard Sit to supine: Min guard   General bed mobility comments: cues for sequence, pt self assisting L LE with gait belt  Transfers Overall transfer level: Needs assistance Equipment used: Rolling walker (2 wheeled) Transfers: Sit to/from Stand Sit to Stand: Min guard;Supervision         General transfer comment: cues for LE management and use of UEs to self assist  Ambulation/Gait Ambulation/Gait assistance: Min guard;Supervision Gait Distance (Feet): 150 Feet Assistive device: Rolling walker (2 wheeled) Gait Pattern/deviations: Decreased step length - right;Decreased step length - left;Shuffle;Trunk flexed;Step-to pattern;Step-through pattern Gait velocity: decreased   General Gait Details: cues for sequence, posture and position from RW.     Stairs Stairs: Yes Stairs assistance: Min guard Stair Management: No rails;Step to pattern;Forwards;With walker Number of Stairs: 3 General stair comments: single step x 3; cues for sequence  and foot/RW placement   Wheelchair Mobility    Modified Rankin (Stroke Patients Only)       Balance Overall balance assessment: Needs assistance Sitting-balance support: Feet supported Sitting balance-Leahy Scale: Good     Standing balance support: During functional activity;Bilateral upper extremity supported Standing balance-Leahy Scale: Fair                              Cognition Arousal/Alertness: Awake/alert Behavior During Therapy: WFL for tasks assessed/performed Overall Cognitive Status: Within Functional Limits for tasks assessed                                        Exercises Total Joint Exercises Ankle Circles/Pumps: AROM;Both;20 reps;Seated Quad Sets: AROM;Left;Seated;10 reps Heel Slides: AROM;Left;AAROM;Seated;20 reps Hip ABduction/ADduction: AAROM;Left;15 reps;Supine Long Arc Quad: AROM;Left;10 reps;Seated    General Comments        Pertinent Vitals/Pain Pain Assessment: 0-10 Pain Score: 4  Pain Location: Lt hip Pain Descriptors / Indicators: Discomfort Pain Intervention(s): Limited activity within patient's tolerance;Monitored during session;Premedicated before session    Home Living                      Prior Function            PT Goals (current goals can now be found in the care plan section) Acute Rehab PT Goals Patient Stated Goal: to get back into garden PT Goal Formulation: With patient Time For Goal Achievement: 11/01/19 Potential to Achieve Goals: Good Progress towards PT goals: Progressing  toward goals    Frequency    7X/week      PT Plan Current plan remains appropriate    Co-evaluation              AM-PAC PT "6 Clicks" Mobility   Outcome Measure  Help needed turning from your back to your side while in a flat bed without using bedrails?: A Little Help needed moving from lying on your back to sitting on the side of a flat bed without using bedrails?: A Little Help needed  moving to and from a bed to a chair (including a wheelchair)?: A Little Help needed standing up from a chair using your arms (e.g., wheelchair or bedside chair)?: A Little Help needed to walk in hospital room?: A Little Help needed climbing 3-5 steps with a railing? : A Little 6 Click Score: 18    End of Session Equipment Utilized During Treatment: Gait belt Activity Tolerance: Patient tolerated treatment well Patient left: with family/visitor present;Other (comment)(sitting EOB) Nurse Communication: Mobility status PT Visit Diagnosis: Muscle weakness (generalized) (M62.81);Difficulty in walking, not elsewhere classified (R26.2)     Time: 1127-1202 PT Time Calculation (min) (ACUTE ONLY): 35 min  Charges:  $Gait Training: 8-22 mins $Therapeutic Exercise: 8-22 mins                     [place] Pager [phone] Office [phone]    Tharon Kitch 10/26/2019, 1:38 PM

## 2019-10-26 NOTE — Plan of Care (Signed)
  Problem: Health Behavior/Discharge Planning: Goal: Ability to manage health-related needs will improve Outcome: Adequate for Discharge   Problem: Clinical Measurements: Goal: Ability to maintain clinical measurements within normal limits will improve Outcome: Adequate for Discharge Goal: Will remain free from infection Outcome: Adequate for Discharge Goal: Diagnostic test results will improve Outcome: Adequate for Discharge Goal: Respiratory complications will improve Outcome: Adequate for Discharge Goal: Cardiovascular complication will be avoided Outcome: Adequate for Discharge   Problem: Activity: Goal: Risk for activity intolerance will decrease Outcome: Adequate for Discharge   Problem: Nutrition: Goal: Adequate nutrition will be maintained Outcome: Adequate for Discharge   Problem: Elimination: Goal: Will not experience complications related to bowel motility Outcome: Adequate for Discharge Goal: Will not experience complications related to urinary retention Outcome: Adequate for Discharge   Problem: Pain Managment: Goal: General experience of comfort will improve Outcome: Adequate for Discharge   Problem: Safety: Goal: Ability to remain free from injury will improve Outcome: Adequate for Discharge   Problem: Skin Integrity: Goal: Risk for impaired skin integrity will decrease Outcome: Adequate for Discharge   Problem: Education: Goal: Understanding of discharge needs will improve Outcome: Adequate for Discharge   Problem: Activity: Goal: Ability to avoid complications of mobility impairment will improve Outcome: Adequate for Discharge Goal: Ability to tolerate increased activity will improve Outcome: Adequate for Discharge   Problem: Clinical Measurements: Goal: Postoperative complications will be avoided or minimized Outcome: Adequate for Discharge   Problem: Pain Management: Goal: Pain level will decrease with appropriate interventions Outcome:  Adequate for Discharge   Problem: Skin Integrity: Goal: Will show signs of wound healing Outcome: Adequate for Discharge

## 2019-10-26 NOTE — Progress Notes (Signed)
Orthopedic Tech Progress Note Patient Details:  Terry Bolton 1951/05/16 [id number]  Ortho Devices Ortho Device/Splint Location: Trapeze bar Ortho Device/Splint Interventions: Application   Post Interventions Instructions Provided: Care of device   [name] 10/26/2019, 9:25 AM

## 2019-10-26 NOTE — Progress Notes (Signed)
Physical Therapy Treatment Patient Details Name: Terry Bolton MRN: [id number] DOB: June 17, 1951 Today's Date: 10/26/2019    History of Present Illness Patient is 68 y.o. male s/p Lt THA on 10/25/19 with PMH significant for HTN, Gout, DM, OA, anginal pain.    PT Comments    Pt motivated but progression ltd this am by c/p increasing dizziness with ambulation - BP 127/80 - RN aware.  Follow Up Recommendations  Follow surgeon's recommendation for DC plan and follow-up therapies     Equipment Recommendations  3in1 (PT)    Recommendations for Other Services       Precautions / Restrictions Precautions Precautions: Fall Restrictions Weight Bearing Restrictions: No Other Position/Activity Restrictions: WBAT    Mobility  Bed Mobility Overal bed mobility: Needs Assistance Bed Mobility: Supine to Sit     Supine to sit: Min assist     General bed mobility comments: cues for use of bed rail and assist required to mobilize Lt LE and raise trunk.  Transfers Overall transfer level: Needs assistance Equipment used: Rolling walker (2 wheeled) Transfers: Sit to/from Stand Sit to Stand: From elevated surface;Min assist         General transfer comment: cues for LE management and use of UEs to self assist  Ambulation/Gait Ambulation/Gait assistance: Min assist Gait Distance (Feet): 33 Feet Assistive device: Rolling walker (2 wheeled) Gait Pattern/deviations: Step-to pattern;Decreased step length - right;Decreased step length - left;Shuffle;Trunk flexed Gait velocity: decreased   General Gait Details: cues for sequence, posture and position from RW.  Distance ltd by c/o dizziness - BP 127/80   Stairs             Wheelchair Mobility    Modified Rankin (Stroke Patients Only)       Balance Overall balance assessment: Needs assistance Sitting-balance support: Feet supported Sitting balance-Leahy Scale: Good     Standing balance support: During functional  activity;Bilateral upper extremity supported Standing balance-Leahy Scale: Poor                              Cognition Arousal/Alertness: Awake/alert Behavior During Therapy: WFL for tasks assessed/performed Overall Cognitive Status: Within Functional Limits for tasks assessed                                        Exercises Total Joint Exercises Ankle Circles/Pumps: AROM;Both;20 reps;Seated Quad Sets: AROM;Left;Seated;10 reps Heel Slides: AROM;Left;AAROM;Seated;20 reps Hip ABduction/ADduction: AAROM;Left;15 reps;Supine Long Arc Quad: AROM;Left;10 reps;Seated    General Comments        Pertinent Vitals/Pain Pain Assessment: 0-10 Pain Score: 4  Pain Location: Lt hip Pain Descriptors / Indicators: Discomfort Pain Intervention(s): Limited activity within patient's tolerance;Monitored during session;Premedicated before session;Ice applied    Home Living                      Prior Function            PT Goals (current goals can now be found in the care plan section) Acute Rehab PT Goals Patient Stated Goal: to get back into garden PT Goal Formulation: With patient Time For Goal Achievement: 11/01/19 Potential to Achieve Goals: Good Progress towards PT goals: Progressing toward goals    Frequency    7X/week      PT Plan Current plan remains appropriate    Co-evaluation  AM-PAC PT "6 Clicks" Mobility   Outcome Measure  Help needed turning from your back to your side while in a flat bed without using bedrails?: A Little Help needed moving from lying on your back to sitting on the side of a flat bed without using bedrails?: A Little Help needed moving to and from a bed to a chair (including a wheelchair)?: A Little Help needed standing up from a chair using your arms (e.g., wheelchair or bedside chair)?: A Little Help needed to walk in hospital room?: A Little Help needed climbing 3-5 steps with a railing? :  A Little 6 Click Score: 18    End of Session Equipment Utilized During Treatment: Gait belt Activity Tolerance: Patient tolerated treatment well Patient left: in chair;with call bell/phone within reach;with chair alarm set;with family/visitor present Nurse Communication: Mobility status PT Visit Diagnosis: Muscle weakness (generalized) (M62.81);Difficulty in walking, not elsewhere classified (R26.2)     Time: 3568-6168 PT Time Calculation (min) (ACUTE ONLY): 27 min  Charges:  $Gait Training: 8-22 mins $Therapeutic Exercise: 8-22 mins                     Mauro Kaufmann PT Acute Rehabilitation Services Pager [phone] Office [phone]    Dajahnae Vondra 10/26/2019, 1:33 PM

## 2019-10-26 NOTE — TOC Progression Note (Signed)
Transition of Care [facility]) - Progression Note    Patient Details  Name: Terry Bolton MRN: [id number] Date of Birth: Feb 01, 1951  [facility] CM/SW Contact  Clearance Coots, LCSW Phone Number: 10/26/2019, 9:26 AM  Clinical Narrative:    Therapy Plan: HEP 3 IN 1 ordered and delivered to bedside by [employer]   Expected Discharge Plan: Home/Self Care(HEP) Barriers to Discharge: No Barriers Identified  Expected Discharge Plan and Services Expected Discharge Plan: Home/Self Care(HEP)         Expected Discharge Date: 10/26/19               DME Arranged: 3-N-1 DME Agency: [employer] Date DME Agency Contacted: 10/26/19 Time DME Agency Contacted: [phone] Representative spoke with at DME Agency: Kipp Brood HH Arranged: NA           Social Determinants of Health (SDOH) Interventions    Readmission Risk Interventions No flowsheet data found.

## 2019-10-26 NOTE — Progress Notes (Signed)
   Subjective: 1 Day Post-Op Procedure(s) (LRB): TOTAL HIP ARTHROPLASTY ANTERIOR APPROACH; CYSTOSCOPY WITH URETHRAL CATHETER INSERTION (Left) Patient reports pain as mild.   Patient seen in rounds with Dr. Lequita Halt. Patient is well, and has had no acute complaints or problems other than soreness in the left hip. Denies chest pain or SOB. No issues overnight. Foley catheter discontinued. We will continue therapy today, ambulated 10' yesterday.   Objective: Vital signs in last 24 hours: Temp:  [97.3 F (36.3 C)-100.1 F (37.8 C)] 99.4 F (37.4 C) (04/22 0629) Pulse Rate:  [47-85] 75 (04/22 0629) Resp:  [11-20] 20 (04/22 0629) BP: (86-162)/(60-107) 140/85 (04/22 0629) SpO2:  [95 %-100 %] 95 % (04/22 0629) Weight:  [92.1 kg] 92.1 kg (04/21 1405)  Intake/Output from previous day:  Intake/Output Summary (Last 24 hours) at 10/26/2019 0728 Last data filed at 10/26/2019 2355 Gross per 24 hour  Intake 3956.25 ml  Output 3275 ml  Net 681.25 ml     Intake/Output this shift: No intake/output data recorded.  Labs: Recent Labs    10/26/19 0316  HGB 13.4   Recent Labs    10/26/19 0316  WBC 11.4*  RBC 3.91*  HCT 37.8*  PLT 202   Recent Labs    10/26/19 0316  NA 132*  K 3.9  CL 99  CO2 22  BUN 14  CREATININE 0.88  GLUCOSE 164*  CALCIUM 8.4*   No results for input(s): LABPT, INR in the last 72 hours.  Exam: General - Patient is Alert and Oriented Extremity - Neurologically intact Neurovascular intact Sensation intact distally Dorsiflexion/Plantar flexion intact Dressing - dressing C/D/I Motor Function - intact, moving foot and toes well on exam.   Past Medical History:  Diagnosis Date  . Anginal pain (HCC)   . Arthritis   . Diabetes mellitus without complication (HCC)   . Gout   . Hemochromatosis   . Hypertension   . PONV (postoperative nausea and vomiting)     Assessment/Plan: 1 Day Post-Op Procedure(s) (LRB): TOTAL HIP ARTHROPLASTY ANTERIOR APPROACH;  CYSTOSCOPY WITH URETHRAL CATHETER INSERTION (Left) Principal Problem:   OA (osteoarthritis) of hip  Estimated body mass index is 27.54 kg/m as calculated from the following:   Height as of this encounter: 6' (1.829 m).   Weight as of this encounter: 92.1 kg. Advance diet Up with therapy D/C IV fluids  DVT Prophylaxis - Aspirin Weight bearing as tolerated. D/C O2 and pulse ox and try on room air. Will continue therapy.  Plan is to go Home after hospital stay. Plan for discharge with HEP later today if progresses with therapy and is meeting his goals. Follow-up in the office in 2 weeks.  Arther Abbott, PA-C Orthopedic Surgery [phone] 10/26/2019, 7:28 AM

## 2019-11-28 DIAGNOSIS — Z96642 Presence of left artificial hip joint: Secondary | ICD-10-CM | POA: Insufficient documentation

## 2019-11-28 HISTORY — DX: Presence of left artificial hip joint: Z96.642

## 2020-04-05 ENCOUNTER — Encounter: Payer: Self-pay | Admitting: Pharmacist

## 2020-04-05 HISTORY — DX: Hereditary hemochromatosis: E83.110

## 2020-04-05 LAB — HEPATIC FUNCTION PANEL
ALT: 27 (ref 10–40)
AST: 33 (ref 14–40)
Alkaline Phosphatase: 101 (ref 25–125)

## 2020-04-05 LAB — BASIC METABOLIC PANEL
BUN: 19 (ref 4–21)
Chloride: 107 (ref 99–108)
Creatinine: 0.8 (ref 0.6–1.3)
Glucose: 139
Potassium: 4 (ref 3.4–5.3)
Sodium: 139 (ref 137–147)

## 2020-04-05 LAB — CBC AND DIFFERENTIAL
HCT: 44 (ref 41–53)
Hemoglobin: 15.6 (ref 13.5–17.5)
Platelets: 194 (ref 150–399)
WBC: 4.4

## 2020-04-05 LAB — IRON,TIBC AND FERRITIN PANEL
%SAT: 95.4
Ferritin: 50
Iron: 231
TIBC: 242

## 2020-04-05 LAB — COMPREHENSIVE METABOLIC PANEL
Albumin: 4.2 (ref 3.5–5.0)
Calcium: 9.4 (ref 8.7–10.7)

## 2020-04-05 LAB — CBC: RBC: 4.56 (ref 3.87–5.11)

## 2020-04-08 ENCOUNTER — Inpatient Hospital Stay: Payer: Medicare PPO

## 2020-04-08 ENCOUNTER — Inpatient Hospital Stay: Payer: Medicare PPO | Attending: Oncology

## 2020-04-08 ENCOUNTER — Other Ambulatory Visit: Payer: Self-pay | Admitting: Hematology and Oncology

## 2020-04-08 ENCOUNTER — Other Ambulatory Visit: Payer: Self-pay

## 2020-04-08 NOTE — Progress Notes (Signed)
Terry Bolton presents today for phlebotomy per MD orders. Phlebotomy procedure started at 1510 and ended at 1525. 489 grams removed. Patient observed for 30 minutes after procedure without any incident. Patient tolerated procedure well. IV needle removed intact.

## 2020-04-12 ENCOUNTER — Telehealth: Payer: Self-pay | Admitting: Oncology

## 2020-04-12 NOTE — Telephone Encounter (Signed)
Called pt to confirm appts scheduled for 11/4 - unable to reach pt , left message for patient with appt date and time

## 2020-05-09 ENCOUNTER — Other Ambulatory Visit: Payer: Self-pay

## 2020-05-09 ENCOUNTER — Inpatient Hospital Stay: Payer: Medicare PPO | Attending: Oncology

## 2020-05-09 NOTE — Patient Instructions (Signed)

## 2020-05-09 NOTE — Progress Notes (Signed)
PT STABLE AT TIME OF DISCHARGE 

## 2020-06-10 ENCOUNTER — Inpatient Hospital Stay: Payer: Medicare PPO | Attending: Oncology

## 2020-06-10 ENCOUNTER — Other Ambulatory Visit: Payer: Self-pay

## 2020-06-10 NOTE — Patient Instructions (Signed)

## 2020-06-10 NOTE — Progress Notes (Signed)
Terry Bolton presents today for phlebotomy per MD orders. Phlebotomy procedure started at 1430 and ended at 1444 480  ML removed. Patient observed for 30 minutes after procedure without any incident. Patient tolerated procedure well. IV needle removed intact.

## 2020-07-11 ENCOUNTER — Other Ambulatory Visit: Payer: Self-pay

## 2020-07-11 ENCOUNTER — Inpatient Hospital Stay: Payer: Medicare PPO | Attending: Oncology

## 2020-07-11 NOTE — Patient Instructions (Signed)

## 2020-07-11 NOTE — Progress Notes (Signed)
Terry Bolton presents today for phlebotomy per MD orders. Phlebotomy procedure started at 1532 and ended at 1552. 480 mL  removed. Patient observed for 30 minutes after procedure without any incident. Patient tolerated procedure well. PAC  needle removed intact. PAC flushed per protocol.

## 2020-08-20 ENCOUNTER — Other Ambulatory Visit: Payer: Self-pay | Admitting: Hematology and Oncology

## 2020-08-20 ENCOUNTER — Inpatient Hospital Stay: Payer: Medicare PPO | Attending: Oncology

## 2020-08-20 ENCOUNTER — Other Ambulatory Visit: Payer: Self-pay

## 2020-08-20 LAB — CBC AND DIFFERENTIAL
HCT: 44 (ref 41–53)
Hemoglobin: 15.8 (ref 13.5–17.5)
Neutrophils Absolute: 2.44
Platelets: 202 (ref 150–399)
WBC: 4.7

## 2020-08-20 LAB — BASIC METABOLIC PANEL
BUN: 13 (ref 4–21)
CO2: 23 — AB (ref 13–22)
Chloride: 104 (ref 99–108)
Creatinine: 1 (ref 0.6–1.3)
Glucose: 208
Potassium: 4.2 (ref 3.4–5.3)
Sodium: 136 — AB (ref 137–147)

## 2020-08-20 LAB — CBC
MCV: 96 — AB (ref 80–94)
RBC: 4.59 (ref 3.87–5.11)

## 2020-08-20 LAB — IRON,TIBC AND FERRITIN PANEL
%SAT: 48.4
Ferritin: 35
Iron: 122
TIBC: 252

## 2020-08-20 LAB — HEPATIC FUNCTION PANEL
ALT: 28 (ref 10–40)
AST: 33 (ref 14–40)
Alkaline Phosphatase: 113 (ref 25–125)
Bilirubin, Total: 0.9

## 2020-08-20 LAB — COMPREHENSIVE METABOLIC PANEL
Albumin: 4.2 (ref 3.5–5.0)
Calcium: 8.9 (ref 8.7–10.7)

## 2020-08-22 ENCOUNTER — Other Ambulatory Visit: Payer: Self-pay | Admitting: Oncology

## 2020-08-22 ENCOUNTER — Inpatient Hospital Stay: Payer: Medicare PPO

## 2020-08-22 ENCOUNTER — Other Ambulatory Visit: Payer: Self-pay

## 2020-08-22 ENCOUNTER — Inpatient Hospital Stay: Payer: Medicare PPO | Admitting: Oncology

## 2020-08-22 NOTE — Progress Notes (Signed)
Terry Bolton presents today for phlebotomy per MD orders. Phlebotomy procedure started at 1458 and ended at 1519 400 ml removed.  Patient tolerated procedure well. IV needle removed intact.  1524: Patient did not want to stay for 30 min wait time. VSS,PT STABLE AT TIME OF DISCHARGE

## 2020-08-22 NOTE — Patient Instructions (Signed)

## 2020-08-30 NOTE — Progress Notes (Signed)
[facility]  [address] [place],  [place]  [address] [phone]  Clinic Day:  08/22/2020  Referring physician: Everlean Cherry, MD   HISTORY OF PRESENT ILLNESS:  The patient is a 70 y.o. 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  He has received monthly phlebotomies over these past few months to bring his iron parameters back under control.  Since his last visit, the patient has been doing well.  He denies having any myalgias or other systemic symptoms which concern him for complications related to his underlying hemochromatosis.     PHYSICAL EXAM:  Blood pressure (!) 150/80, pulse (!) 55, temperature 97.9 F (36.6 C), resp. rate 16, height 5\' 11"  (1.803 m), weight 205 lb 6.4 oz (93.2 kg), SpO2 96 %. Wt Readings from Last 3 Encounters:  08/22/20 201 lb (91.2 kg)  08/22/20 205 lb 6.4 oz (93.2 kg)  07/11/20 208 lb 0.1 oz (94.4 kg)   Body mass index is 28.65 kg/m. Performance status (ECOG): 0 - Asymptomatic Physical Exam Constitutional:      Appearance: Normal appearance. He is not ill-appearing.  HENT:     Mouth/Throat:     Mouth: Mucous membranes are moist.     Pharynx: Oropharynx is clear. No oropharyngeal exudate or posterior oropharyngeal erythema.  Cardiovascular:     Rate and Rhythm: Normal rate and regular rhythm.     Heart sounds: No murmur heard. No friction rub. No gallop.   Pulmonary:     Effort: Pulmonary effort is normal. No respiratory distress.     Breath sounds: Normal breath sounds. No wheezing, rhonchi or rales.  Chest:  Breasts:     Right: No axillary adenopathy or supraclavicular adenopathy.     Left: No axillary adenopathy or supraclavicular adenopathy.    Abdominal:     General: Bowel sounds are normal. There is no distension.     Palpations: Abdomen is soft. There is no mass.     Tenderness: There is no abdominal tenderness.  Musculoskeletal:        General: No swelling.     Right lower leg: No edema.      Left lower leg: No edema.  Lymphadenopathy:     Cervical: No cervical adenopathy.     Upper Body:     Right upper body: No supraclavicular or axillary adenopathy.     Left upper body: No supraclavicular or axillary adenopathy.     Lower Body: No right inguinal adenopathy. No left inguinal adenopathy.  Skin:    General: Skin is warm.     Coloration: Skin is not jaundiced.     Findings: No lesion or rash.  Neurological:     General: No focal deficit present.     Mental Status: He is alert and oriented to person, place, and time. Mental status is at baseline.     Cranial Nerves: Cranial nerves are intact.  Psychiatric:        Mood and Affect: Mood normal.        Behavior: Behavior normal.        Thought Content: Thought content normal.     LABS:   CBC Latest Ref Rng & Units 08/20/2020 04/05/2020 10/26/2019  WBC - 4.7 4.4 11.4(H)  Hemoglobin 13.5 - 17.5 15.8 15.6 13.4  Hematocrit 41 - 53 44 44 37.8(L)  Platelets 150 - 399 202 194 202   CMP Latest Ref Rng & Units 08/20/2020 04/05/2020 10/26/2019  Glucose 70 - 99 mg/dL - - 10/28/2019)  BUN 4 - 21 13 19 14   Creatinine 0.6 - 1.3 1.0 0.8 0.88  Sodium 137 - 147 136(A) 139 132(L)  Potassium 3.4 - 5.3 4.2 4.0 3.9  Chloride 99 - 108 104 107 99  CO2 13 - 22 23(A) - 22  Calcium 8.7 - 10.7 8.9 9.4 8.4(L)  Total Protein 6.5 - 8.1 g/dL - - -  Total Bilirubin 0.3 - 1.2 mg/dL - - -  Alkaline Phos 25 - 125 113 101 -  AST 14 - 40 33 33 -  ALT 10 - 40 28 27 -     ASSESSMENT & PLAN:   Assessment/Plan:  A 70 y.o. 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  Although his ferritin is at 50, his other iron parameters are relatively high.  I will arrange for him to be phlebotomized this week and in 2 months to keep his iron parameters from precipitously rising.  Otherwise, I will see him back in 4 months to reassess his iron parameters.  The patient understands all the plans discussed today and is in agreement with them.    Dequincy 78, MD

## 2020-09-03 IMAGING — RF DG C-ARM 1-60 MIN-NO REPORT
1 series · 2 of 2 positions shown · non-contrast
Comparison: None.

CLINICAL DATA: Left anterior total hip arthroplasty.

EXAM:
OPERATIVE LEFT HIP (WITH PELVIS IF PERFORMED) 2 VIEWS
TECHNIQUE: Fluoroscopic spot image(s) were submitted for interpretation
post-operatively.

[Series 1: unknown protocol · 0.20mm/px · 2 of 2 slices shown]
[im 1/2]
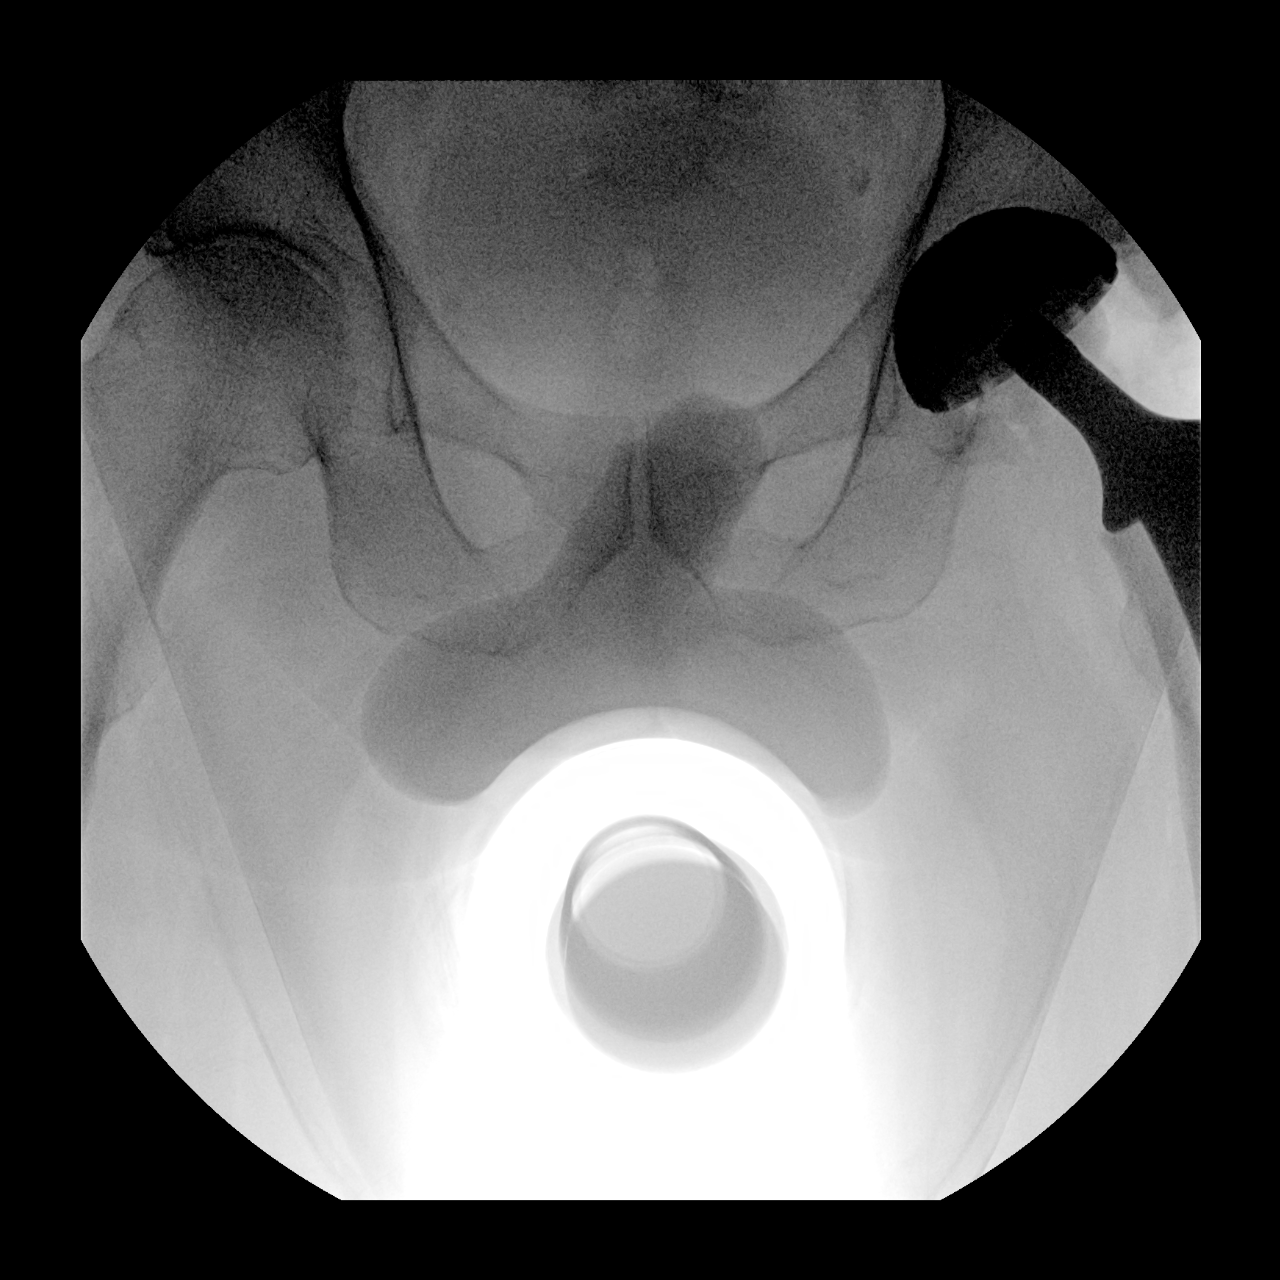
[im 2/2]
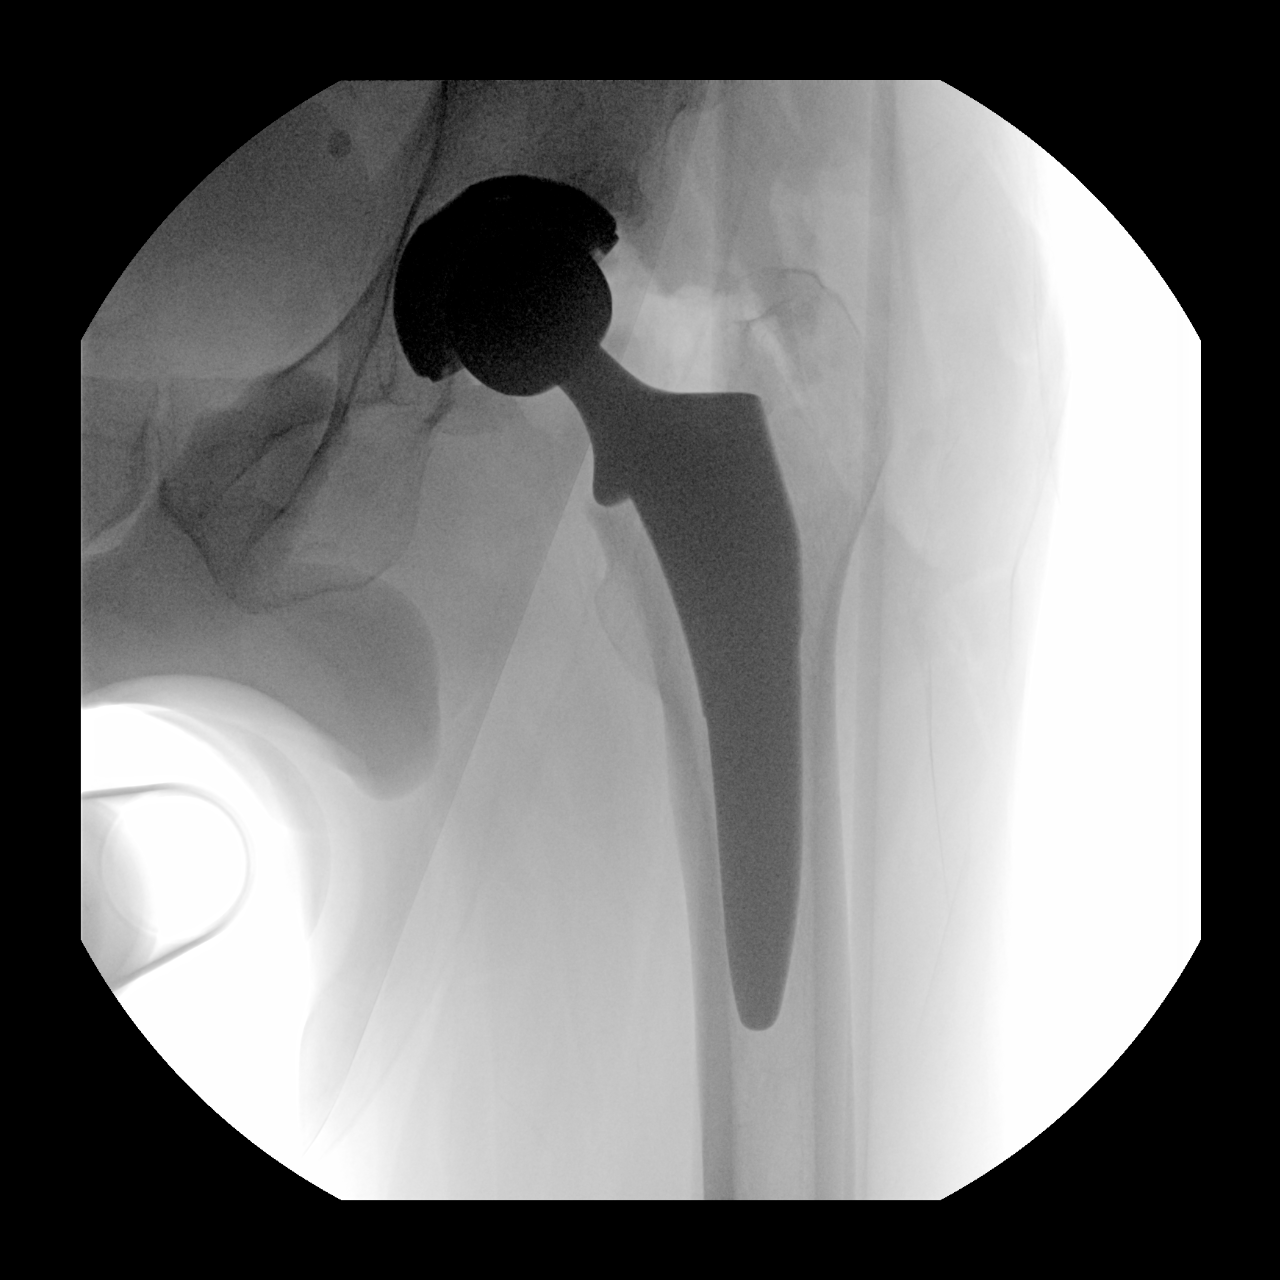

[2 of 2 positions shown; findings below may reference images not displayed]

FINDINGS: C-arm fluoroscopy was provided in the operating room. Fluoroscopy
time 6 seconds. Spot fluoroscopic images of the left hip and lower
pelvis demonstrate left total hip arthroplasty. The hardware is well
positioned. No evidence of acute fracture or dislocation. There is
gas within the soft tissues surrounding the left hip.
IMPRESSION: No demonstrated complication following left total hip arthroplasty.

## 2020-09-03 IMAGING — RF DG HIP (WITH PELVIS) OPERATIVE*L*
1 series · 2 of 2 positions shown · non-contrast
Comparison: None.

CLINICAL DATA: Left anterior total hip arthroplasty.

EXAM:
OPERATIVE LEFT HIP (WITH PELVIS IF PERFORMED) 2 VIEWS
TECHNIQUE: Fluoroscopic spot image(s) were submitted for interpretation
post-operatively.

[Series 1: unknown protocol · 0.20mm/px · 2 of 2 slices shown]
[im 1/2]
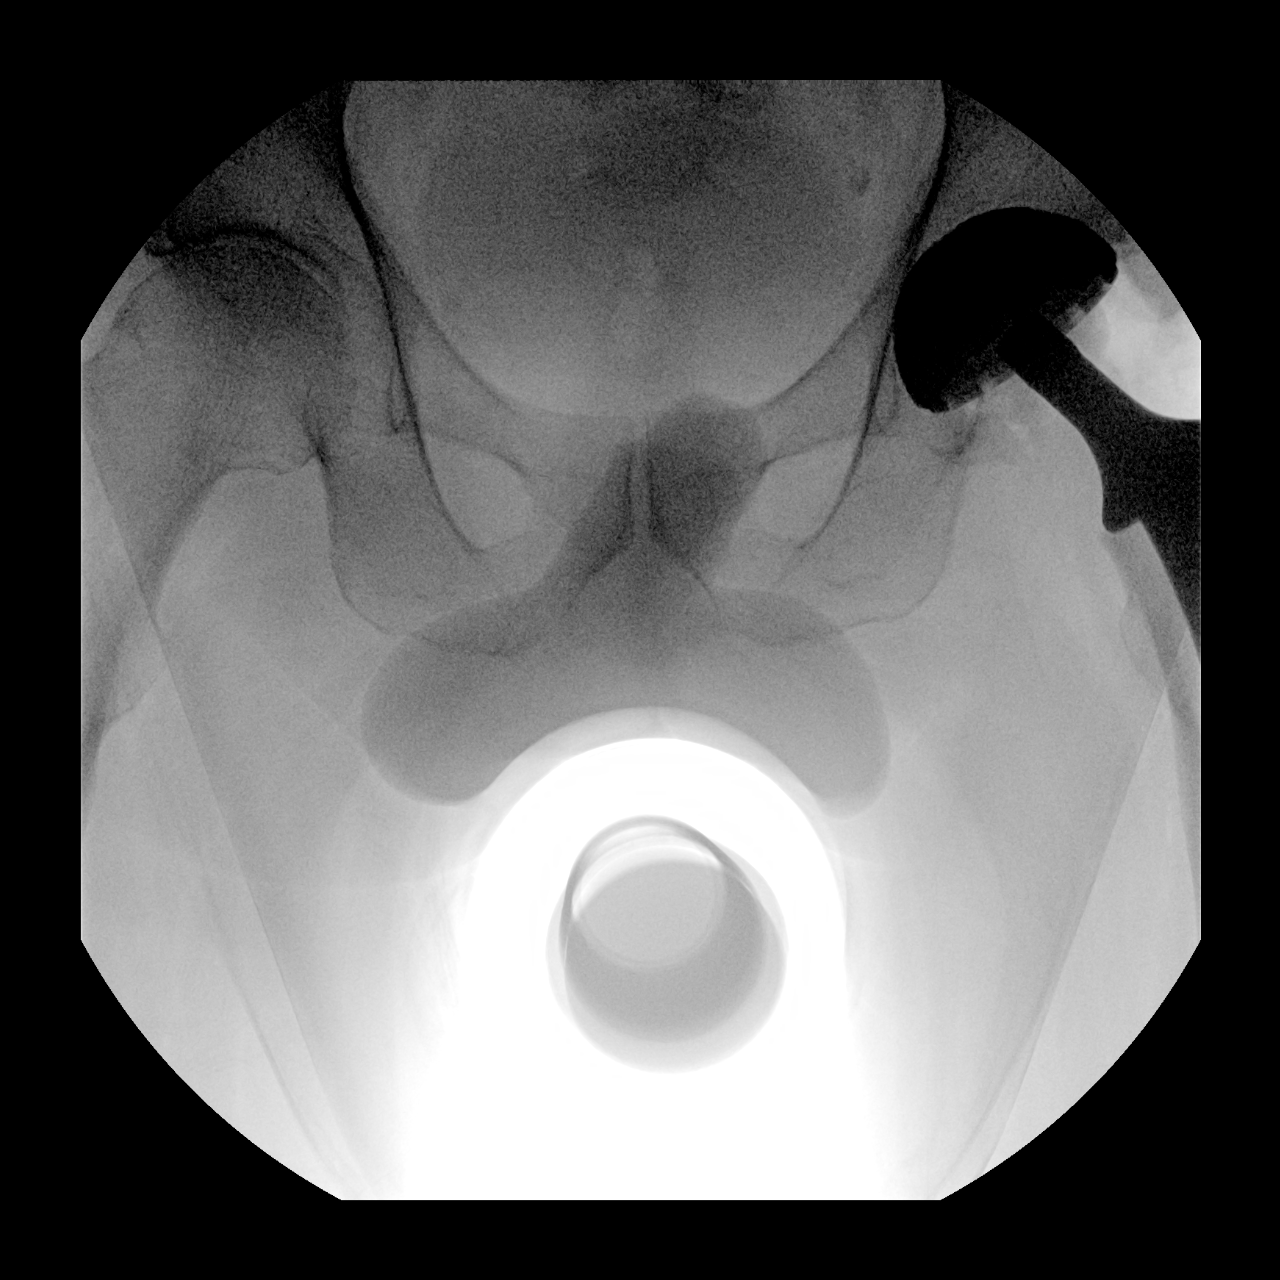
[im 2/2]
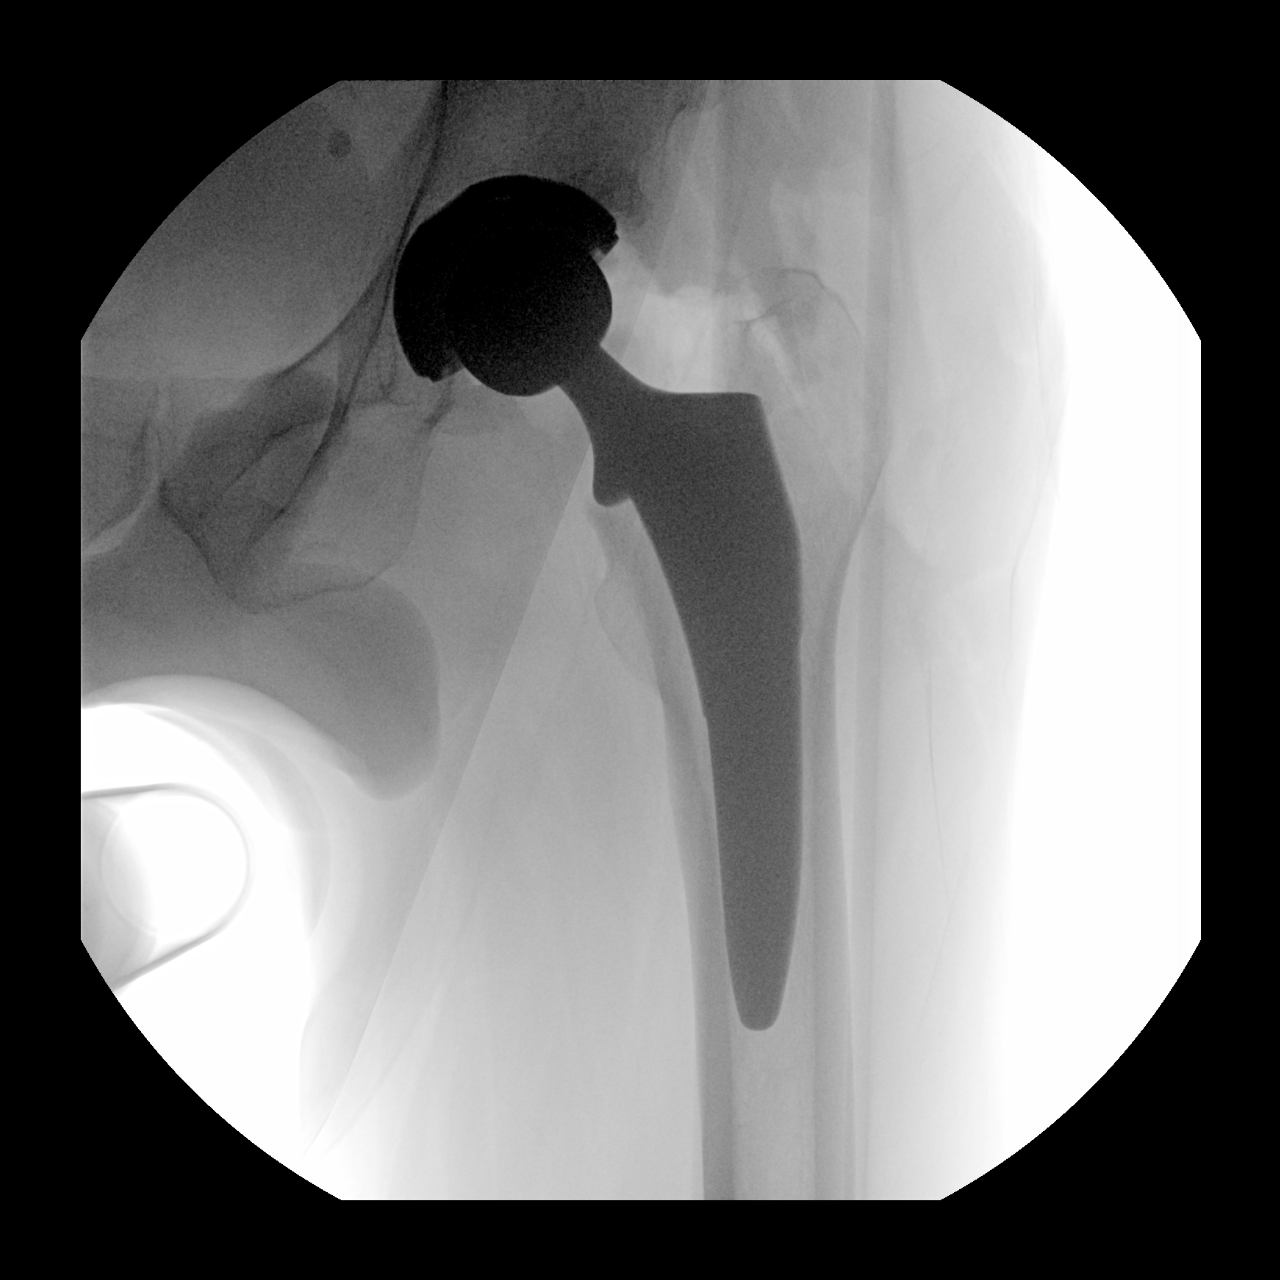

[2 of 2 positions shown; findings below may reference images not displayed]

FINDINGS: C-arm fluoroscopy was provided in the operating room. Fluoroscopy
time 6 seconds. Spot fluoroscopic images of the left hip and lower
pelvis demonstrate left total hip arthroplasty. The hardware is well
positioned. No evidence of acute fracture or dislocation. There is
gas within the soft tissues surrounding the left hip.
IMPRESSION: No demonstrated complication following left total hip arthroplasty.

## 2020-10-18 ENCOUNTER — Inpatient Hospital Stay: Payer: Medicare PPO | Attending: Oncology

## 2020-10-18 ENCOUNTER — Other Ambulatory Visit: Payer: Self-pay

## 2020-10-18 DIAGNOSIS — Z452 Encounter for adjustment and management of vascular access device: Secondary | ICD-10-CM | POA: Diagnosis present

## 2020-10-18 NOTE — Progress Notes (Signed)
Pt d/c stable at 1515 

## 2020-10-18 NOTE — Patient Instructions (Signed)

## 2020-12-20 ENCOUNTER — Inpatient Hospital Stay: Payer: Medicare PPO | Admitting: Oncology

## 2020-12-20 ENCOUNTER — Other Ambulatory Visit: Payer: Self-pay

## 2020-12-20 ENCOUNTER — Inpatient Hospital Stay: Payer: Medicare PPO | Attending: Oncology

## 2020-12-20 ENCOUNTER — Encounter: Payer: Self-pay | Admitting: Oncology

## 2020-12-20 LAB — IRON AND TIBC
Iron: 164 ug/dL (ref 45–182)
Saturation Ratios: 63 % — ABNORMAL HIGH (ref 17.9–39.5)
TIBC: 261 ug/dL (ref 250–450)
UIBC: 97 ug/dL

## 2020-12-20 LAB — FERRITIN: Ferritin: 43 ng/mL (ref 24–336)

## 2020-12-20 LAB — BASIC METABOLIC PANEL
BUN: 15 (ref 4–21)
CO2: 22 (ref 13–22)
Chloride: 104 (ref 99–108)
Creatinine: 0.8 (ref 0.6–1.3)
Glucose: 241
Potassium: 4.3 (ref 3.4–5.3)
Sodium: 136 — AB (ref 137–147)

## 2020-12-20 LAB — HEPATIC FUNCTION PANEL
ALT: 28 (ref 10–40)
AST: 34 (ref 14–40)
Alkaline Phosphatase: 98 (ref 25–125)
Bilirubin, Total: 0.9

## 2020-12-20 LAB — COMPREHENSIVE METABOLIC PANEL
Albumin: 4.1 (ref 3.5–5.0)
Calcium: 8.6 — AB (ref 8.7–10.7)

## 2020-12-20 LAB — CBC: RBC: 4.51 (ref 3.87–5.11)

## 2020-12-20 LAB — CBC AND DIFFERENTIAL
HCT: 44 (ref 41–53)
Hemoglobin: 15.3 (ref 13.5–17.5)
Neutrophils Absolute: 2.01
Platelets: 194 (ref 150–399)
WBC: 3.8

## 2021-01-16 NOTE — Progress Notes (Signed)
[facility]  [address] [place],  [place]  [address] [phone]  Clinic Day:  01/21/2021  Referring physician: Everlean Cherry, MD  This document serves as a record of services personally performed by Jenan Ellegood Kirby Funk, MD. It was created on their behalf by [facility] E, a trained medical scribe. The creation of this record is based on the scribe's personal observations and the provider's statements to them.  HISTORY OF PRESENT ILLNESS:  The patient is a 70 y.o. 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  He has received bimonthly phlebotomies over these past few months to get his iron parameters back under control.  He comes in today for routine follow-up.  Since his last visit, the patient has been doing well.  He denies having any myalgias or other systemic symptoms which concern him for complications related to his underlying hemochromatosis.    PHYSICAL EXAM:  Blood pressure (!) 153/92, pulse 66, temperature 98.6 F (37 C), temperature source Oral, resp. rate 18, height 5\' 11"  (1.803 m), weight 200 lb 3.2 oz (90.8 kg), SpO2 95 %. Wt Readings from Last 3 Encounters:  01/21/21 200 lb 3.2 oz (90.8 kg)  10/18/20 199 lb 1.9 oz (90.3 kg)  08/22/20 201 lb (91.2 kg)   Body mass index is 27.92 kg/m. Performance status (ECOG): 0 - Asymptomatic Physical Exam Constitutional:      Appearance: Normal appearance. He is not ill-appearing.  HENT:     Mouth/Throat:     Mouth: Mucous membranes are moist.     Pharynx: Oropharynx is clear. No oropharyngeal exudate or posterior oropharyngeal erythema.  Cardiovascular:     Rate and Rhythm: Normal rate and regular rhythm.     Heart sounds: No murmur heard.   No friction rub. No gallop.  Pulmonary:     Effort: Pulmonary effort is normal. No respiratory distress.     Breath sounds: Normal breath sounds. No wheezing, rhonchi or rales.  Chest:  Breasts:    Right: No axillary adenopathy or supraclavicular  adenopathy.     Left: No axillary adenopathy or supraclavicular adenopathy.  Abdominal:     General: Bowel sounds are normal. There is no distension.     Palpations: Abdomen is soft. There is no mass.     Tenderness: There is no abdominal tenderness.  Musculoskeletal:        General: No swelling.     Right lower leg: No edema.     Left lower leg: No edema.  Lymphadenopathy:     Cervical: No cervical adenopathy.     Upper Body:     Right upper body: No supraclavicular or axillary adenopathy.     Left upper body: No supraclavicular or axillary adenopathy.     Lower Body: No right inguinal adenopathy. No left inguinal adenopathy.  Skin:    General: Skin is warm.     Coloration: Skin is not jaundiced.     Findings: No lesion or rash.  Neurological:     General: No focal deficit present.     Mental Status: He is alert and oriented to person, place, and time. Mental status is at baseline.     Cranial Nerves: Cranial nerves are intact.  Psychiatric:        Mood and Affect: Mood normal.        Behavior: Behavior normal.        Thought Content: Thought content normal.    LABS:   CBC Latest Ref Rng & Units 01/21/2021 12/20/2020  08/20/2020  WBC - 4.5 3.8 4.7  Hemoglobin 13.5 - 17.5 15.8 15.3 15.8  Hematocrit 41 - 53 45 44 44  Platelets 150 - 399 207 194 202   CMP Latest Ref Rng & Units 01/21/2021 12/20/2020 08/20/2020  Glucose 70 - 99 mg/dL - - -  BUN 4 - 21 15 15 13   Creatinine 0.6 - 1.3 1.1 0.8 1.0  Sodium 137 - 147 139 136(A) 136(A)  Potassium 3.4 - 5.3 4.0 4.3 4.2  Chloride 99 - 108 104 104 104  CO2 13 - 22 25(A) 22 23(A)  Calcium 8.7 - 10.7 9.2 8.6(A) 8.9  Total Protein 6.5 - 8.1 g/dL - - -  Total Bilirubin 0.3 - 1.2 mg/dL - - -  Alkaline Phos 25 - 125 91 98 113  AST 14 - 40 41(A) 34 33  ALT 10 - 40 30 28 28      Ref. Range 01/21/2021 10:11  Iron Latest Ref Range: 45 - 182 ug/dL (H)  UIBC Latest Units: ug/dL 61  TIBC Latest Ref Range: 250 - 450 ug/dL 01/23/2021  Saturation  Ratios Latest Ref Range: 17.9 - 39.5 % 78 (H)  Ferritin Latest Ref Range: 24 - 336 ng/mL 63     ASSESSMENT & PLAN:  Assessment/Plan:  A 70 y.o. 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  As his ferritin is at 50 and his other iron parameters are high,  I will arrange for him to be phlebotomized this week and in 2 months to keep his iron parameters from precipitously rising.  Otherwise, I will see him back in 4 months to reassess his iron parameters.  The patient understands all the plans discussed today and is in agreement with them.     I, 673, am acting as scribe for 78, MD    I have reviewed this report as typed by the medical scribe, and it is complete and accurate.  Sheccid Lahmann Foye Deer, MD

## 2021-01-20 ENCOUNTER — Other Ambulatory Visit: Payer: Self-pay | Admitting: Oncology

## 2021-01-21 ENCOUNTER — Inpatient Hospital Stay: Payer: Medicare PPO | Attending: Oncology | Admitting: Oncology

## 2021-01-21 ENCOUNTER — Other Ambulatory Visit: Payer: Self-pay | Admitting: Oncology

## 2021-01-21 ENCOUNTER — Other Ambulatory Visit: Payer: Self-pay

## 2021-01-21 ENCOUNTER — Telehealth: Payer: Self-pay | Admitting: Oncology

## 2021-01-21 ENCOUNTER — Encounter: Payer: Self-pay | Admitting: Oncology

## 2021-01-21 ENCOUNTER — Inpatient Hospital Stay: Payer: Medicare PPO

## 2021-01-21 DIAGNOSIS — Z452 Encounter for adjustment and management of vascular access device: Secondary | ICD-10-CM | POA: Diagnosis not present

## 2021-01-21 DIAGNOSIS — Z79899 Other long term (current) drug therapy: Secondary | ICD-10-CM | POA: Insufficient documentation

## 2021-01-21 LAB — HEPATIC FUNCTION PANEL
ALT: 30 (ref 10–40)
AST: 41 — AB (ref 14–40)
Alkaline Phosphatase: 91 (ref 25–125)
Bilirubin, Total: 1.1

## 2021-01-21 LAB — BASIC METABOLIC PANEL
BUN: 15 (ref 4–21)
CO2: 25 — AB (ref 13–22)
Chloride: 104 (ref 99–108)
Creatinine: 1.1 (ref 0.6–1.3)
Glucose: 162
Potassium: 4 (ref 3.4–5.3)
Sodium: 139 (ref 137–147)

## 2021-01-21 LAB — CBC AND DIFFERENTIAL
HCT: 45 (ref 41–53)
Hemoglobin: 15.8 (ref 13.5–17.5)
Neutrophils Absolute: 2.34
Platelets: 207 (ref 150–399)
WBC: 4.5

## 2021-01-21 LAB — IRON AND TIBC
Iron: 214 ug/dL — ABNORMAL HIGH (ref 45–182)
Saturation Ratios: 78 % — ABNORMAL HIGH (ref 17.9–39.5)
TIBC: 275 ug/dL (ref 250–450)
UIBC: 61 ug/dL

## 2021-01-21 LAB — FERRITIN: Ferritin: 63 ng/mL (ref 24–336)

## 2021-01-21 LAB — COMPREHENSIVE METABOLIC PANEL
Albumin: 4.4 (ref 3.5–5.0)
Calcium: 9.2 (ref 8.7–10.7)

## 2021-01-21 LAB — CBC: RBC: 4.62 (ref 3.87–5.11)

## 2021-01-21 NOTE — Telephone Encounter (Signed)
Per 7/19 los next appt scheduled and given to patient 

## 2021-01-23 ENCOUNTER — Other Ambulatory Visit: Payer: Self-pay

## 2021-01-23 ENCOUNTER — Inpatient Hospital Stay: Payer: Medicare PPO

## 2021-01-23 MED ORDER — SODIUM CHLORIDE 0.9% FLUSH
10.0000 mL | INTRAVENOUS | Status: DC | PRN
  Administered 2021-01-23: 10 mL via INTRAVENOUS
  Filled 2021-01-23: qty 10

## 2021-01-23 MED ORDER — HEPARIN SOD (PORK) LOCK FLUSH 100 UNIT/ML IV SOLN
500.0000 [IU] | Freq: Once | INTRAVENOUS | Status: AC
  Administered 2021-01-23: 500 [IU] via INTRAVENOUS
  Filled 2021-01-23: qty 5

## 2021-01-23 NOTE — Progress Notes (Signed)
Felipe Drone presents today for phlebotomy per MD orders. Phlebotomy procedure started at 1539 and ended at 93. 450 ml removed. Patient observed for 30 minutes after procedure without any incident. Patient tolerated procedure well. IV needle removed intact.

## 2021-03-24 ENCOUNTER — Ambulatory Visit: Payer: Medicare PPO

## 2021-03-27 ENCOUNTER — Inpatient Hospital Stay: Payer: Medicare PPO | Attending: Oncology

## 2021-03-27 ENCOUNTER — Other Ambulatory Visit: Payer: Self-pay

## 2021-03-27 NOTE — Progress Notes (Signed)
Discharged home, stable  

## 2021-03-27 NOTE — Progress Notes (Signed)
Felipe Drone presents today for phlebotomy per MD orders. Phlebotomy procedure started at 1515 and ended at 1539 480 cc removed. Patient tolerated procedure well. IV needle removed intact.

## 2021-05-16 NOTE — Progress Notes (Signed)
[facility]  [address] [place],  [place]  [address] [phone]  Clinic Day:  05/23/2021  Referring physician: Everlean Cherry, MD  This document serves as a record of services personally performed by Terry Kirby Funk, MD. It was created on their behalf by [facility] E, a trained medical scribe. The creation of this record is based on the scribe's personal observations and the provider's statements to them.  HISTORY OF PRESENT ILLNESS:  The patient is a 70 y.o. 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  He has received bimonthly phlebotomies over these past few months to get his iron parameters back under control.  He comes in today for routine follow-up.  Since his last visit, the patient has been doing well.  He denies having any myalgias or other systemic symptoms which concern him for complications related to his underlying hemochromatosis.    PHYSICAL EXAM:  Blood pressure (!) 161/89, pulse (!) 52, temperature 98 F (36.7 C), resp. rate 16, height 5\' 11"  (1.803 m), weight 208 lb 9.6 oz (94.6 kg), SpO2 96 %. Wt Readings from Last 3 Encounters:  05/23/21 208 lb 9.6 oz (94.6 kg)  03/27/21 204 lb 6.4 oz (92.7 kg)  01/23/21 203 lb (92.1 kg)   Body mass index is 29.09 kg/m. Performance status (ECOG): 0 - Asymptomatic Physical Exam Constitutional:      Appearance: Normal appearance. He is not ill-appearing.  HENT:     Mouth/Throat:     Mouth: Mucous membranes are moist.     Pharynx: Oropharynx is clear. No oropharyngeal exudate or posterior oropharyngeal erythema.  Cardiovascular:     Rate and Rhythm: Regular rhythm. Bradycardia present.     Heart sounds: No murmur heard.   No friction rub. No gallop.  Pulmonary:     Effort: Pulmonary effort is normal. No respiratory distress.     Breath sounds: Normal breath sounds. No wheezing, rhonchi or rales.  Abdominal:     General: Bowel sounds are normal. There is no distension.     Palpations:  Abdomen is soft. There is no mass.     Tenderness: There is no abdominal tenderness.  Musculoskeletal:        General: No swelling.     Right lower leg: No edema.     Left lower leg: No edema.  Lymphadenopathy:     Cervical: No cervical adenopathy.     Upper Body:     Right upper body: No supraclavicular or axillary adenopathy.     Left upper body: No supraclavicular or axillary adenopathy.     Lower Body: No right inguinal adenopathy. No left inguinal adenopathy.  Skin:    General: Skin is warm.     Coloration: Skin is not jaundiced.     Findings: No lesion or rash.  Neurological:     General: No focal deficit present.     Mental Status: He is alert and oriented to person, place, and time. Mental status is at baseline.  Psychiatric:        Mood and Affect: Mood normal.        Behavior: Behavior normal.        Thought Content: Thought content normal.    LABS:   CBC Latest Ref Rng & Units 05/22/2021 01/21/2021 12/20/2020  WBC - 4.5 4.5 3.8  Hemoglobin 13.5 - 17.5 16.1 15.8 15.3  Hematocrit 41 - 53 45 45 44  Platelets 150 - 399 185 207 194   CMP Latest Ref Rng &  Units 05/22/2021 01/21/2021 12/20/2020  Glucose 70 - 99 mg/dL - - -  BUN 4 - 21 16 15 15   Creatinine 0.6 - 1.3 0.9 1.1 0.8  Sodium 137 - 147 138 139 136(A)  Potassium 3.4 - 5.3 4.4 4.0 4.3  Chloride 99 - 108 105 104 104  CO2 13 - 22 26(A) 25(A) 22  Calcium 8.7 - 10.7 8.7 9.2 8.6(A)  Total Protein 6.5 - 8.1 g/dL - - -  Total Bilirubin 0.3 - 1.2 mg/dL - - -  Alkaline Phos 25 - 125 103 91 98  AST 14 - 40 49(A) 41(A) 34  ALT 10 - 40 43(A) 30 28     Latest Reference Range & Units 05/22/21 10:07  Iron 45 - 182 ug/dL 189 (H)  UIBC ug/dL 73  TIBC 250 - 450 ug/dL 262  Saturation Ratios 17.9 - 39.5 % 72 (H)  Ferritin 24 - 336 ng/mL 101    ASSESSMENT & PLAN:  Assessment/Plan:  A 70 y.o. 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  As his ferritin is well above 50 and his other iron parameters are high,  I will arrange  for him to be phlebotomized today, in 1 month and in 2 months to get his iron parameters under better control.  Otherwise, I will see him back in 4 months to reassess his iron parameters.  The patient understands all the plans discussed today and is in agreement with them.     I, Rita Ohara, am acting as scribe for Marice Potter, MD    I have reviewed this report as typed by the medical scribe, and it is complete and accurate.  Terry Macarthur Critchley, MD

## 2021-05-22 ENCOUNTER — Other Ambulatory Visit: Payer: Self-pay | Admitting: Hematology and Oncology

## 2021-05-22 ENCOUNTER — Inpatient Hospital Stay: Payer: Medicare PPO | Attending: Oncology

## 2021-05-22 DIAGNOSIS — Z79899 Other long term (current) drug therapy: Secondary | ICD-10-CM | POA: Diagnosis not present

## 2021-05-22 LAB — BASIC METABOLIC PANEL
BUN: 16 (ref 4–21)
CO2: 26 — AB (ref 13–22)
Chloride: 105 (ref 99–108)
Creatinine: 0.9 (ref 0.6–1.3)
Glucose: 221
Potassium: 4.4 (ref 3.4–5.3)
Sodium: 138 (ref 137–147)

## 2021-05-22 LAB — IRON AND TIBC
Iron: 189 ug/dL — ABNORMAL HIGH (ref 45–182)
Saturation Ratios: 72 % — ABNORMAL HIGH (ref 17.9–39.5)
TIBC: 262 ug/dL (ref 250–450)
UIBC: 73 ug/dL

## 2021-05-22 LAB — HEPATIC FUNCTION PANEL
ALT: 43 — AB (ref 10–40)
AST: 49 — AB (ref 14–40)
Alkaline Phosphatase: 103 (ref 25–125)
Bilirubin, Total: 0.9

## 2021-05-22 LAB — CBC AND DIFFERENTIAL
HCT: 45 (ref 41–53)
Hemoglobin: 16.1 (ref 13.5–17.5)
Neutrophils Absolute: 2.21
Platelets: 185 (ref 150–399)
WBC: 4.5

## 2021-05-22 LAB — FERRITIN: Ferritin: 101 ng/mL (ref 24–336)

## 2021-05-22 LAB — COMPREHENSIVE METABOLIC PANEL
Albumin: 4.1 (ref 3.5–5.0)
Calcium: 8.7 (ref 8.7–10.7)

## 2021-05-22 LAB — CBC
MCV: 99 — AB (ref 80–94)
RBC: 4.55 (ref 3.87–5.11)

## 2021-05-23 ENCOUNTER — Inpatient Hospital Stay (INDEPENDENT_AMBULATORY_CARE_PROVIDER_SITE_OTHER): Payer: Medicare PPO | Admitting: Oncology

## 2021-05-23 ENCOUNTER — Inpatient Hospital Stay: Payer: Medicare PPO

## 2021-05-23 ENCOUNTER — Telehealth: Payer: Self-pay | Admitting: Oncology

## 2021-05-23 ENCOUNTER — Other Ambulatory Visit: Payer: Self-pay

## 2021-05-23 NOTE — Telephone Encounter (Signed)
Per 11/18 los next appt scheduled and given to patient 

## 2021-05-23 NOTE — Patient Instructions (Signed)

## 2021-05-23 NOTE — Progress Notes (Signed)
Terry Bolton presents today for phlebotomy per MD orders. Phlebotomy procedure started at 1515 and ended at 1530. 480 ML removed. Patient observed after procedure without any incident. Patient tolerated procedure well. IV needle removed intact, #19 ga Huber needle used via R chest PAC, PAC flushed per protocol

## 2021-06-23 ENCOUNTER — Other Ambulatory Visit: Payer: Self-pay

## 2021-06-23 ENCOUNTER — Inpatient Hospital Stay: Payer: Medicare PPO | Attending: Oncology

## 2021-06-23 NOTE — Progress Notes (Signed)
Felipe Drone presents today for phlebotomy per MD orders. Phlebotomy procedure started at 1106 and ended at 1121. 480 grams removed. Patient observed after procedure without any incident. Patient tolerated procedure well. 19 ga portacath needle removed intact.

## 2021-07-24 ENCOUNTER — Other Ambulatory Visit: Payer: Self-pay | Admitting: Hematology and Oncology

## 2021-07-24 ENCOUNTER — Inpatient Hospital Stay: Payer: Medicare PPO | Attending: Oncology

## 2021-07-24 ENCOUNTER — Other Ambulatory Visit: Payer: Self-pay

## 2021-07-24 DIAGNOSIS — Z452 Encounter for adjustment and management of vascular access device: Secondary | ICD-10-CM | POA: Insufficient documentation

## 2021-07-24 MED ORDER — HEPARIN SOD (PORK) LOCK FLUSH 100 UNIT/ML IV SOLN
500.0000 [IU] | Freq: Once | INTRAVENOUS | Status: AC | PRN
  Administered 2021-07-24: 500 [IU]

## 2021-07-24 MED ORDER — SODIUM CHLORIDE 0.9% FLUSH
10.0000 mL | INTRAVENOUS | Status: DC | PRN
  Administered 2021-07-24: 10 mL

## 2021-07-24 NOTE — Progress Notes (Signed)
Felipe Drone presents today for phlebotomy per MD orders. Phlebotomy procedure started at 1505 and ended at 1525 500  cc removed. Patient tolerated procedure well. IV needle removed intact.

## 2021-07-24 NOTE — Addendum Note (Signed)
Addended by: Clovis Cao on: 07/24/2021 03:46 PM   Modules accepted: Orders

## 2021-07-24 NOTE — Addendum Note (Signed)
Addended by: Clovis Cao on: 07/24/2021 04:15 PM   Modules accepted: Orders

## 2021-09-11 NOTE — Progress Notes (Incomplete)
?[facility]  ?[address] ?[place],  [place]  [address] ?[phone] ? ?Clinic Day:  09/19/2021 ? ?Referring physician: Everlean Cherry, MD ? ?This document serves as a record of services personally performed by Weston Settle, MD. It was created on their behalf by Curry,Lauren E, a trained medical scribe. The creation of this record is based on the scribe's personal observations and the provider's statements to them. ? ?HISTORY OF PRESENT ILLNESS:  ?The patient is a 71 y.o. 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  He has received bimonthly phlebotomies over these past few months to get his iron parameters back under control.  He comes in today for routine follow-up.  Since his last visit, the patient has been doing well.  He denies having any myalgias or other systemic symptoms which concern him for complications related to his underlying hemochromatosis.   ? ?PHYSICAL EXAM:  ?There were no vitals taken for this visit. ?Wt Readings from Last 3 Encounters:  ?07/24/21 203 lb 1.3 oz (92.1 kg)  ?05/23/21 208 lb (94.3 kg)  ?05/23/21 208 lb 9.6 oz (94.6 kg)  ? ?There is no height or weight on file to calculate BMI. ?Performance status (ECOG): 0 - Asymptomatic ?Physical Exam ?Constitutional:   ?   Appearance: Normal appearance. He is not ill-appearing.  ?HENT:  ?   Mouth/Throat:  ?   Mouth: Mucous membranes are moist.  ?   Pharynx: Oropharynx is clear. No oropharyngeal exudate or posterior oropharyngeal erythema.  ?Cardiovascular:  ?   Rate and Rhythm: Regular rhythm. Bradycardia present.  ?   Heart sounds: No murmur heard. ?  No friction rub. No gallop.  ?Pulmonary:  ?   Effort: Pulmonary effort is normal. No respiratory distress.  ?   Breath sounds: Normal breath sounds. No wheezing, rhonchi or rales.  ?Abdominal:  ?   General: Bowel sounds are normal. There is no distension.  ?   Palpations: Abdomen is soft. There is no mass.  ?   Tenderness: There is no abdominal tenderness.   ?Musculoskeletal:     ?   General: No swelling.  ?   Right lower leg: No edema.  ?   Left lower leg: No edema.  ?Lymphadenopathy:  ?   Cervical: No cervical adenopathy.  ?   Upper Body:  ?   Right upper body: No supraclavicular or axillary adenopathy.  ?   Left upper body: No supraclavicular or axillary adenopathy.  ?   Lower Body: No right inguinal adenopathy. No left inguinal adenopathy.  ?Skin: ?   General: Skin is warm.  ?   Coloration: Skin is not jaundiced.  ?   Findings: No lesion or rash.  ?Neurological:  ?   General: No focal deficit present.  ?   Mental Status: He is alert and oriented to person, place, and time. Mental status is at baseline.  ?Psychiatric:     ?   Mood and Affect: Mood normal.     ?   Behavior: Behavior normal.     ?   Thought Content: Thought content normal.  ? ? ?LABS:  ? ?CBC Latest Ref Rng & Units 05/22/2021 01/21/2021 12/20/2020  ?WBC - 4.5 4.5 3.8  ?Hemoglobin 13.5 - 17.5 16.1 15.8 15.3  ?Hematocrit 41 - 53 45 45 44  ?Platelets 150 - 399 185 207 194  ? ?CMP Latest Ref Rng & Units 05/22/2021 01/21/2021 12/20/2020  ?Glucose 70 - 99 mg/dL - - -  ?BUN 4 -  21 16 15 15   ?Creatinine 0.6 - 1.3 0.9 1.1 0.8  ?Sodium 137 - 147 138 139 136(A)  ?Potassium 3.4 - 5.3 4.4 4.0 4.3  ?Chloride 99 - 108 105 104 104  ?CO2 13 - 22 26(A) 25(A) 22  ?Calcium 8.7 - 10.7 8.7 9.2 8.6(A)  ?Total Protein 6.5 - 8.1 g/dL - - -  ?Total Bilirubin 0.3 - 1.2 mg/dL - - -  ?Alkaline Phos 25 - 125 103 91 98  ?AST 14 - 40 49(A) 41(A) 34  ?ALT 10 - 40 43(A) 30 28  ? ? ? Latest Reference Range & Units 05/22/21 10:07  ?Iron 45 - 182 ug/dL 05/24/21 (H)  ?UIBC ug/dL 73  ?TIBC 250 - 450 ug/dL 409  ?Saturation Ratios 17.9 - 39.5 % 72 (H)  ?Ferritin 24 - 336 ng/mL 101  ? ? ?ASSESSMENT & PLAN:  ?Assessment/Plan:  A 71 y.o. 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  As his ferritin is well above 50 and his other iron parameters are high,  I will arrange for him to be phlebotomized today, in 1 month and in 2 months to get his iron parameters  under better control.  Otherwise, I will see him back in 4 months to reassess his iron parameters.  The patient understands all the plans discussed today and is in agreement with them.   ? ? ?I, 66, am acting as scribe for Terry Deer, MD   ? ?I have reviewed this report as typed by the medical scribe, and it is complete and accurate. ? ?Terry Weston Settle, MD ? ? ? ?  ?

## 2021-09-17 ENCOUNTER — Other Ambulatory Visit: Payer: Self-pay | Admitting: Hematology and Oncology

## 2021-09-18 ENCOUNTER — Other Ambulatory Visit: Payer: Self-pay

## 2021-09-18 ENCOUNTER — Inpatient Hospital Stay: Payer: Medicare PPO | Attending: Oncology

## 2021-09-18 DIAGNOSIS — J069 Acute upper respiratory infection, unspecified: Secondary | ICD-10-CM | POA: Diagnosis not present

## 2021-09-18 DIAGNOSIS — Z79899 Other long term (current) drug therapy: Secondary | ICD-10-CM | POA: Insufficient documentation

## 2021-09-18 LAB — CBC: RBC: 4.84 (ref 3.87–5.11)

## 2021-09-18 LAB — IRON AND TIBC
Iron: 102 ug/dL (ref 45–182)
Saturation Ratios: 37 % (ref 17.9–39.5)
TIBC: 278 ug/dL (ref 250–450)
UIBC: 176 ug/dL

## 2021-09-18 LAB — CBC AND DIFFERENTIAL
HCT: 47 (ref 41–53)
Hemoglobin: 16.1 (ref 13.5–17.5)
Neutrophils Absolute: 2.49
Platelets: 188 10*3/uL (ref 150–400)
WBC: 4.3

## 2021-09-18 LAB — FERRITIN: Ferritin: 63 ng/mL (ref 24–336)

## 2021-09-19 ENCOUNTER — Ambulatory Visit: Payer: Medicare PPO | Admitting: Oncology

## 2021-09-23 NOTE — Progress Notes (Signed)
?[facility]  ?[address] ?[place],  [place]  [address] ?[phone] ? ?Clinic Day:  09/24/2021 ? ?Referring physician: Everlean Cherry, MD ? ?HISTORY OF PRESENT ILLNESS:  ?The patient is a 71 y.o. 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  He receives routine phlebotomies to keep his iron parameters under some semblance of control.  He comes in today for routine follow-up.  Since his last visit, the patient has been doing well.  He denies having any myalgias or other systemic symptoms which concern him for complications related to his underlying hemochromatosis.  However, he has dealt with an upper respiratory infection for the past 10 days. ? ?PHYSICAL EXAM:  ?Blood pressure (!) 158/76, pulse 62, temperature 98.3 ?F (36.8 ?C), resp. rate 16, height 5\' 11"  (1.803 m), weight 199 lb 4.8 oz (90.4 kg), SpO2 98 %. ?Wt Readings from Last 3 Encounters:  ?09/24/21 199 lb 4.8 oz (90.4 kg)  ?07/24/21 203 lb 1.3 oz (92.1 kg)  ?05/23/21 208 lb (94.3 kg)  ? ?Body mass index is 27.8 kg/m?05/25/21 ?Performance status (ECOG): 0 - Asymptomatic ?Physical Exam ?Constitutional:   ?   Appearance: Normal appearance. He is not ill-appearing.  ?HENT:  ?   Mouth/Throat:  ?   Mouth: Mucous membranes are moist.  ?   Pharynx: Oropharynx is clear. No oropharyngeal exudate or posterior oropharyngeal erythema.  ?Cardiovascular:  ?   Rate and Rhythm: Regular rhythm.  ?   Heart sounds: No murmur heard. ?  No friction rub. No gallop.  ?Pulmonary:  ?   Effort: Pulmonary effort is normal. No respiratory distress.  ?   Breath sounds: Normal breath sounds. No wheezing, rhonchi or rales.  ?Abdominal:  ?   General: Bowel sounds are normal. There is no distension.  ?   Palpations: Abdomen is soft. There is no mass.  ?   Tenderness: There is no abdominal tenderness.  ?Musculoskeletal:     ?   General: No swelling.  ?   Right lower leg: No edema.  ?   Left lower leg: No edema.  ?Lymphadenopathy:  ?   Cervical: No cervical  adenopathy.  ?   Upper Body:  ?   Right upper body: No supraclavicular or axillary adenopathy.  ?   Left upper body: No supraclavicular or axillary adenopathy.  ?   Lower Body: No right inguinal adenopathy. No left inguinal adenopathy.  ?Skin: ?   General: Skin is warm.  ?   Coloration: Skin is not jaundiced.  ?   Findings: No lesion or rash.  ?Neurological:  ?   General: No focal deficit present.  ?   Mental Status: He is alert and oriented to person, place, and time. Mental status is at baseline.  ?Psychiatric:     ?   Mood and Affect: Mood normal.     ?   Behavior: Behavior normal.     ?   Thought Content: Thought content normal.  ? ?LABS:  ? ? ?  Latest Ref Rng & Units 09/18/2021  ? 12:00 AM 05/22/2021  ? 12:00 AM 01/21/2021  ? 12:00 AM  ?CBC  ?WBC  4.3      4.5      4.5    ?Hemoglobin 13.5 - 17.5 16.1      16.1      15.8    ?Hematocrit 41 - 53 47      45      45    ?Platelets 150 - 400  K/uL 188      185      207    ?  ? This result is from an external source.  ? ? ?  Latest Ref Rng & Units 05/22/2021  ? 12:00 AM 01/21/2021  ? 12:00 AM 12/20/2020  ? 12:00 AM  ?CMP  ?BUN 4 - 21 16      15   15     ?Creatinine 0.6 - 1.3 0.9      1.1   0.8    ?Sodium 137 - 147 138      139   136    ?Potassium 3.4 - 5.3 4.4      4.0   4.3    ?Chloride 99 - 108 105      104   104    ?CO2 13 - 22 26      25   22     ?Calcium 8.7 - 10.7 8.7      9.2   8.6    ?Alkaline Phos 25 - 125 103      91   98    ?AST 14 - 40 49      41   34    ?ALT 10 - 40 43      30   28    ?  ? This result is from an external source.  ? ? Latest Reference Range & Units 05/22/21 10:07 09/18/21 09:19  ?Iron 45 - 182 ug/dL 05/24/21 (H) 09/20/21  ?UIBC ug/dL 73 570  ?TIBC 250 - 450 ug/dL 177 939  ?Saturation Ratios 17.9 - 39.5 % 72 (H) 37  ?Ferritin 24 - 336 ng/mL 101 63  ?(H): Data is abnormally high ? ?ASSESSMENT & PLAN:  ?Assessment/Plan:  A 71 y.o. 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  Although better, as his ferritin is above 50, I will arrange for him to be phlebotomized  next week and in 2 months to get his iron parameters under ideal control.  I will also prescribe him a Z-Pak for his upper respiratory infection.  Otherwise, I will see him back in 4 months to reassess his iron parameters.  The patient understands all the plans discussed today and is in agreement with them.   ? ?Kiante Petrovich 092, MD ? ? ? ?  ?

## 2021-09-24 ENCOUNTER — Other Ambulatory Visit: Payer: Self-pay | Admitting: Oncology

## 2021-09-24 ENCOUNTER — Telehealth: Payer: Self-pay | Admitting: Oncology

## 2021-09-24 ENCOUNTER — Other Ambulatory Visit: Payer: Self-pay

## 2021-09-24 ENCOUNTER — Inpatient Hospital Stay: Payer: Medicare PPO | Admitting: Oncology

## 2021-09-24 MED ORDER — AZITHROMYCIN 250 MG PO TABS: ORAL_TABLET | ORAL | 0 refills | Status: DC

## 2021-09-24 NOTE — Telephone Encounter (Signed)
Per 09/24/21 los next appt scheduled and confirmed with patient ?

## 2021-09-25 ENCOUNTER — Encounter: Payer: Self-pay | Admitting: Oncology

## 2021-10-02 ENCOUNTER — Inpatient Hospital Stay: Payer: Medicare PPO

## 2021-10-02 NOTE — Progress Notes (Signed)
Felipe Drone presents today for phlebotomy per MD orders. ?Phlebotomy procedure started at 1512 and ended at [id number]. ?480 mL removed. ?Patient observed after procedure without any incident. ?Patient tolerated procedure well. ?IV needle removed intact. ? ? ?

## 2021-10-02 NOTE — Patient Instructions (Signed)

## 2021-11-28 ENCOUNTER — Encounter: Payer: Self-pay | Admitting: Oncology

## 2021-12-02 ENCOUNTER — Inpatient Hospital Stay: Payer: Medicare PPO | Attending: Oncology

## 2021-12-02 DIAGNOSIS — Z79899 Other long term (current) drug therapy: Secondary | ICD-10-CM | POA: Diagnosis not present

## 2021-12-02 MED ORDER — SODIUM CHLORIDE 0.9% FLUSH
10.0000 mL | INTRAVENOUS | Status: DC | PRN
  Administered 2021-12-02: 10 mL

## 2021-12-02 MED ORDER — HEPARIN SOD (PORK) LOCK FLUSH 100 UNIT/ML IV SOLN
500.0000 [IU] | Freq: Once | INTRAVENOUS | Status: AC | PRN
  Administered 2021-12-02: 500 [IU]

## 2021-12-02 NOTE — Patient Instructions (Signed)

## 2021-12-02 NOTE — Progress Notes (Signed)
Terry Bolton presents today for phlebotomy per MD orders. Phlebotomy procedure started at 1509 and ended at 1525. 500 grams removed. Patient observed for 30 minutes after procedure without any incident. Patient tolerated procedure well. IV needle removed intact.

## 2022-01-22 ENCOUNTER — Inpatient Hospital Stay: Payer: Medicare PPO | Attending: Oncology

## 2022-01-22 ENCOUNTER — Other Ambulatory Visit: Payer: Self-pay | Admitting: Oncology

## 2022-01-22 DIAGNOSIS — Z79899 Other long term (current) drug therapy: Secondary | ICD-10-CM | POA: Diagnosis not present

## 2022-01-22 LAB — FERRITIN: Ferritin: 67 ng/mL (ref 24–336)

## 2022-01-22 LAB — BASIC METABOLIC PANEL
BUN: 20 (ref 4–21)
CO2: 21 (ref 13–22)
Chloride: 106 (ref 99–108)
Creatinine: 0.8 (ref 0.6–1.3)
Glucose: 190
Potassium: 4.3 mEq/L (ref 3.5–5.1)
Sodium: 136 — AB (ref 137–147)

## 2022-01-22 LAB — HEPATIC FUNCTION PANEL
ALT: 31 U/L (ref 10–40)
AST: 36 (ref 14–40)
Alkaline Phosphatase: 123 (ref 25–125)
Bilirubin, Total: 0.5

## 2022-01-22 LAB — IRON AND TIBC
Iron: 188 ug/dL — ABNORMAL HIGH (ref 45–182)
Saturation Ratios: 75 % — ABNORMAL HIGH (ref 17.9–39.5)
TIBC: 251 ug/dL (ref 250–450)
UIBC: 63 ug/dL

## 2022-01-22 LAB — CBC: RBC: 4.4 (ref 3.87–5.11)

## 2022-01-22 LAB — CBC AND DIFFERENTIAL
HCT: 43 (ref 41–53)
Hemoglobin: 15.2 (ref 13.5–17.5)
Neutrophils Absolute: 2.24
Platelets: 184 10*3/uL (ref 150–400)
WBC: 4.3

## 2022-01-22 LAB — COMPREHENSIVE METABOLIC PANEL
Albumin: 4 (ref 3.5–5.0)
Calcium: 8.7 (ref 8.7–10.7)

## 2022-01-22 NOTE — Progress Notes (Signed)
[facility]  [address] [place],  [place]  [address] [phone]  Clinic Day:  01/23/2022  Referring physician: Everlean Cherry, MD  HISTORY OF PRESENT ILLNESS:  The patient is a 71 y.o. 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  He receives routine phlebotomies to keep his iron parameters under some semblance of control.  He comes in today for routine follow-up.  Since his last visit, the patient has been doing well.  He denies having any myalgias or other systemic symptoms which concern him for complications related to his underlying hemochromatosis.    PHYSICAL EXAM:  Blood pressure (!) 162/81, pulse (!) 57, temperature 98.4 F (36.9 C), resp. rate 16, height 5\' 11"  (1.803 m), weight 204 lb 12.8 oz (92.9 kg), SpO2 97 %. Wt Readings from Last 3 Encounters:  01/23/22 204 lb 12.8 oz (92.9 kg)  09/24/21 199 lb 4.8 oz (90.4 kg)  07/24/21 203 lb 1.3 oz (92.1 kg)   Body mass index is 28.56 kg/m. Performance status (ECOG): 0 - Asymptomatic Physical Exam Constitutional:      Appearance: Normal appearance. He is not ill-appearing.  HENT:     Mouth/Throat:     Mouth: Mucous membranes are moist.     Pharynx: Oropharynx is clear. No oropharyngeal exudate or posterior oropharyngeal erythema.  Cardiovascular:     Rate and Rhythm: Regular rhythm.     Heart sounds: No murmur heard.    No friction rub. No gallop.  Pulmonary:     Effort: Pulmonary effort is normal. No respiratory distress.     Breath sounds: Normal breath sounds. No wheezing, rhonchi or rales.  Abdominal:     General: Bowel sounds are normal. There is no distension.     Palpations: Abdomen is soft. There is no mass.     Tenderness: There is no abdominal tenderness.  Musculoskeletal:        General: No swelling.     Right lower leg: No edema.     Left lower leg: No edema.  Lymphadenopathy:     Cervical: No cervical adenopathy.     Upper Body:     Right upper body: No supraclavicular  or axillary adenopathy.     Left upper body: No supraclavicular or axillary adenopathy.     Lower Body: No right inguinal adenopathy. No left inguinal adenopathy.  Skin:    General: Skin is warm.     Coloration: Skin is not jaundiced.     Findings: No lesion or rash.  Neurological:     General: No focal deficit present.     Mental Status: He is alert and oriented to person, place, and time. Mental status is at baseline.  Psychiatric:        Mood and Affect: Mood normal.        Behavior: Behavior normal.        Thought Content: Thought content normal.    LABS:      Latest Ref Rng & Units 01/22/2022   12:00 AM 09/18/2021   12:00 AM 05/22/2021   12:00 AM  CBC  WBC  4.3     4.3     4.5      Hemoglobin 13.5 - 17.5 15.2     16.1     16.1      Hematocrit 41 - 53 43     47     45      Platelets 150 - 400 K/uL 184     188  185         This result is from an external source.      Latest Ref Rng & Units 01/22/2022   12:00 AM 05/22/2021   12:00 AM 01/21/2021   12:00 AM  CMP  BUN 4 - 21 20     16     15    Creatinine 0.6 - 1.3 0.8     0.9     1.1   Sodium 137 - 147 136     138     139   Potassium 3.5 - 5.1 mEq/L 4.3     4.4     4.0   Chloride 99 - 108 106     105     104   CO2 13 - 22 21     26     25    Calcium 8.7 - 10.7 8.7     8.7     9.2   Alkaline Phos 25 - 125 123     103     91   AST 14 - 40 36     49     41   ALT 10 - 40 U/L 31     43     30      This result is from an external source.    Latest Reference Range & Units 01/22/22 10:13  Iron 45 - 182 ug/dL (H)  UIBC ug/dL 63  TIBC 01/24/22 - 643 ug/dL 329  Saturation Ratios 17.9 - 39.5 % 75 (H)  Ferritin 24 - 336 ng/mL 67  (H): Data is abnormally high  ASSESSMENT & PLAN:  Assessment/Plan:  A 71 y.o. 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  When evaluating his most recent iron parameters, his ferritin is above 50.  His other iron parameters are also higher than what I would like for them to be.  Based upon this, I  will arrange for him to be phlebotomized monthly for these next 3 months.  I will see him back in 4 months to reassess his iron parameters to see how well he responded to his upcoming monthly phlebotomies.  The patient understands all the plans discussed today and is in agreement with them.    Razia Screws 841, MD

## 2022-01-23 ENCOUNTER — Inpatient Hospital Stay: Payer: Medicare PPO | Admitting: Oncology

## 2022-01-28 ENCOUNTER — Other Ambulatory Visit: Payer: Self-pay | Admitting: Hematology and Oncology

## 2022-01-30 ENCOUNTER — Inpatient Hospital Stay: Payer: Medicare PPO

## 2022-01-30 MED ORDER — SODIUM CHLORIDE 0.9% FLUSH: 10.0000 mL | INTRAVENOUS | Status: DC | PRN

## 2022-01-30 MED ORDER — HEPARIN SOD (PORK) LOCK FLUSH 100 UNIT/ML IV SOLN: 500.0000 [IU] | Freq: Once | INTRAVENOUS | Status: DC | PRN

## 2022-01-30 NOTE — Patient Instructions (Signed)

## 2022-01-30 NOTE — Progress Notes (Signed)
Terry Bolton presents today for phlebotomy per MD orders. Phlebotomy procedure started at 1438 and ended at 1501. 480 grams removed. Patient observed for 5 minutes after procedure without any incident. Patient tolerated procedure well. IV needle removed intact. PAC FLUSHED PER PROTOCOL

## 2022-03-02 ENCOUNTER — Inpatient Hospital Stay: Payer: Medicare PPO | Attending: Oncology

## 2022-03-02 DIAGNOSIS — Z79899 Other long term (current) drug therapy: Secondary | ICD-10-CM | POA: Insufficient documentation

## 2022-03-02 NOTE — Progress Notes (Signed)
Terry Bolton presents today for phlebotomy per MD orders. Phlebotomy procedure started at 1520 and ended at 1530. 499 grams removed. Patient observed for 5 minutes after procedure without any incident. Patient tolerated procedure well. IV needle removed intact. USED RIGHT AC, NO 19 GAUGE HUBER NEEDLES AVAILABLE, SO PORT WAS NOT USED

## 2022-03-02 NOTE — Patient Instructions (Signed)

## 2022-04-02 ENCOUNTER — Inpatient Hospital Stay: Payer: Medicare PPO | Attending: Oncology

## 2022-04-02 DIAGNOSIS — Z452 Encounter for adjustment and management of vascular access device: Secondary | ICD-10-CM | POA: Diagnosis present

## 2022-04-02 MED ORDER — HEPARIN SOD (PORK) LOCK FLUSH 100 UNIT/ML IV SOLN
500.0000 [IU] | Freq: Once | INTRAVENOUS | Status: AC | PRN
  Administered 2022-04-02: 500 [IU]

## 2022-04-02 MED ORDER — SODIUM CHLORIDE 0.9% FLUSH
10.0000 mL | INTRAVENOUS | Status: DC | PRN
  Administered 2022-04-02: 10 mL

## 2022-04-02 MED ORDER — ALTEPLASE 2 MG IJ SOLR: 2.0000 mg | Freq: Once | INTRAMUSCULAR | Status: DC | PRN

## 2022-04-02 NOTE — Progress Notes (Signed)
Terry Bolton presents today for phlebotomy per MD orders. Phlebotomy procedure started at 1510 and ended at 1538. 500 cc removed. Patient tolerated procedure well. IV needle removed intact.

## 2022-05-25 ENCOUNTER — Inpatient Hospital Stay: Payer: Medicare PPO | Attending: Oncology

## 2022-05-25 DIAGNOSIS — Z79899 Other long term (current) drug therapy: Secondary | ICD-10-CM | POA: Insufficient documentation

## 2022-05-25 LAB — CBC WITH DIFFERENTIAL (CANCER CENTER ONLY)
Abs Immature Granulocytes: 0.04 10*3/uL (ref 0.00–0.07)
Basophils Absolute: 0.1 10*3/uL (ref 0.0–0.1)
Basophils Relative: 1 %
Eosinophils Absolute: 0.2 10*3/uL (ref 0.0–0.5)
Eosinophils Relative: 5 %
HCT: 44.2 % (ref 39.0–52.0)
Hemoglobin: 16 g/dL (ref 13.0–17.0)
Immature Granulocytes: 1 %
Lymphocytes Relative: 33 %
Lymphs Abs: 1.5 10*3/uL (ref 0.7–4.0)
MCH: 34.7 pg — ABNORMAL HIGH (ref 26.0–34.0)
MCHC: 36.2 g/dL — ABNORMAL HIGH (ref 30.0–36.0)
MCV: 95.9 fL (ref 80.0–100.0)
Monocytes Absolute: 0.4 10*3/uL (ref 0.1–1.0)
Monocytes Relative: 9 %
Neutro Abs: 2.3 10*3/uL (ref 1.7–7.7)
Neutrophils Relative %: 51 %
Platelet Count: 220 10*3/uL (ref 150–400)
RBC: 4.61 MIL/uL (ref 4.22–5.81)
RDW: 11.3 % — ABNORMAL LOW (ref 11.5–15.5)
WBC Count: 4.5 10*3/uL (ref 4.0–10.5)
nRBC: 0 % (ref 0.0–0.2)

## 2022-05-25 LAB — FERRITIN: Ferritin: 55 ng/mL (ref 24–336)

## 2022-05-25 LAB — IRON AND TIBC
Iron: 217 ug/dL — ABNORMAL HIGH (ref 45–182)
Saturation Ratios: 80 % — ABNORMAL HIGH (ref 17.9–39.5)
TIBC: 273 ug/dL (ref 250–450)
UIBC: 56 ug/dL

## 2022-05-25 NOTE — Progress Notes (Unsigned)
[facility]  [address] [place],  [place]  [address] [phone]  Clinic Day:  01/23/2022  Referring physician: Everlean Cherry, MD  HISTORY OF PRESENT ILLNESS:  The patient is a 71 y.o. 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  He receives routine phlebotomies to keep his iron parameters under some semblance of control.  He comes in today for routine follow-up.  Since his last visit, the patient has been doing well.  He denies having any myalgias or other systemic symptoms which concern him for complications related to his underlying hemochromatosis.    PHYSICAL EXAM:  There were no vitals taken for this visit. Wt Readings from Last 3 Encounters:  01/23/22 204 lb 12.8 oz (92.9 kg)  09/24/21 199 lb 4.8 oz (90.4 kg)  07/24/21 203 lb 1.3 oz (92.1 kg)   There is no height or weight on file to calculate BMI. Performance status (ECOG): 0 - Asymptomatic Physical Exam Constitutional:      Appearance: Normal appearance. He is not ill-appearing.  HENT:     Mouth/Throat:     Mouth: Mucous membranes are moist.     Pharynx: Oropharynx is clear. No oropharyngeal exudate or posterior oropharyngeal erythema.  Cardiovascular:     Rate and Rhythm: Regular rhythm.     Heart sounds: No murmur heard.    No friction rub. No gallop.  Pulmonary:     Effort: Pulmonary effort is normal. No respiratory distress.     Breath sounds: Normal breath sounds. No wheezing, rhonchi or rales.  Abdominal:     General: Bowel sounds are normal. There is no distension.     Palpations: Abdomen is soft. There is no mass.     Tenderness: There is no abdominal tenderness.  Musculoskeletal:        General: No swelling.     Right lower leg: No edema.     Left lower leg: No edema.  Lymphadenopathy:     Cervical: No cervical adenopathy.     Upper Body:     Right upper body: No supraclavicular or axillary adenopathy.     Left upper body: No supraclavicular or axillary adenopathy.      Lower Body: No right inguinal adenopathy. No left inguinal adenopathy.  Skin:    General: Skin is warm.     Coloration: Skin is not jaundiced.     Findings: No lesion or rash.  Neurological:     General: No focal deficit present.     Mental Status: He is alert and oriented to person, place, and time. Mental status is at baseline.  Psychiatric:        Mood and Affect: Mood normal.        Behavior: Behavior normal.        Thought Content: Thought content normal.    LABS:      Latest Ref Rng & Units 05/25/2022    9:23 AM 01/22/2022   12:00 AM 09/18/2021   12:00 AM  CBC  WBC 4.0 - 10.5 K/uL 4.5  4.3     4.3      Hemoglobin 13.0 - 17.0 g/dL 27.7  41.2     87.8      Hematocrit 39.0 - 52.0 % 44.2  43     47      Platelets 150 - 400 K/uL 220  184     188         This result is from an external source.  Latest Ref Rng & Units 01/22/2022   12:00 AM 05/22/2021   12:00 AM 01/21/2021   12:00 AM  CMP  BUN 4 - 21 20     16     15    Creatinine 0.6 - 1.3 0.8     0.9     1.1   Sodium 137 - 147 136     138     139   Potassium 3.5 - 5.1 mEq/L 4.3     4.4     4.0   Chloride 99 - 108 106     105     104   CO2 13 - 22 21     26     25    Calcium 8.7 - 10.7 8.7     8.7     9.2   Alkaline Phos 25 - 125 123     103     91   AST 14 - 40 36     49     41   ALT 10 - 40 U/L 31     43     30      This result is from an external source.     Latest Reference Range & Units 05/25/22 09:23  Iron 45 - 182 ug/dL (H)  UIBC ug/dL 56  TIBC 05/27/22 - 374 ug/dL 827  Saturation Ratios 17.9 - 39.5 % 80 (H)  Ferritin 24 - 336 ng/mL 55  (H): Data is abnormally high  ASSESSMENT & PLAN:  Assessment/Plan:  A 71 y.o. 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  When evaluating his most recent iron parameters, his ferritin is above 50.  His other iron parameters are also higher than what I would like for them to be.  Based upon this, I will arrange for him to be phlebotomized monthly for these next 3  months.  I will see him back in 4 months to reassess his iron parameters to see how well he responded to his upcoming monthly phlebotomies.  The patient understands all the plans discussed today and is in agreement with them.    Catlynn Grondahl 675, MD

## 2022-05-26 ENCOUNTER — Other Ambulatory Visit: Payer: Self-pay | Admitting: Oncology

## 2022-05-26 ENCOUNTER — Inpatient Hospital Stay: Payer: Medicare PPO | Admitting: Oncology

## 2022-06-02 ENCOUNTER — Inpatient Hospital Stay: Payer: Medicare PPO

## 2022-06-02 NOTE — Progress Notes (Signed)
Felipe Drone presents today for phlebotomy per MD orders. Phlebotomy procedure started at 1506 and ended at 1518. 480 ML removed (portacath) Patient observed for 10 minutes after procedure without any incident. Patient tolerated procedure well. Huber needle removed intact.

## 2022-07-02 ENCOUNTER — Inpatient Hospital Stay: Payer: Medicare PPO | Attending: Oncology

## 2022-07-02 DIAGNOSIS — Z452 Encounter for adjustment and management of vascular access device: Secondary | ICD-10-CM | POA: Diagnosis not present

## 2022-07-02 MED ORDER — HEPARIN SOD (PORK) LOCK FLUSH 100 UNIT/ML IV SOLN
500.0000 [IU] | Freq: Once | INTRAVENOUS | Status: AC | PRN
  Administered 2022-07-02: 500 [IU]

## 2022-07-02 MED ORDER — SODIUM CHLORIDE 0.9% FLUSH
10.0000 mL | INTRAVENOUS | Status: DC | PRN
  Administered 2022-07-02: 10 mL

## 2022-07-02 NOTE — Progress Notes (Signed)
Terry Bolton presents today for phlebotomy per MD orders. Phlebotomy procedure started at 1505 and ended at 1520. 480 grams removed. Patient observed for after procedure without any incident. Patient tolerated procedure well. IV needle removed intact PAC FLUSHED PER PROTOCOL

## 2022-07-02 NOTE — Patient Instructions (Signed)

## 2022-08-03 ENCOUNTER — Inpatient Hospital Stay: Payer: Medicare PPO | Attending: Oncology

## 2022-09-03 ENCOUNTER — Inpatient Hospital Stay: Payer: Medicare PPO

## 2022-09-04 ENCOUNTER — Inpatient Hospital Stay: Payer: Medicare PPO | Attending: Oncology

## 2022-09-04 DIAGNOSIS — Z79899 Other long term (current) drug therapy: Secondary | ICD-10-CM | POA: Diagnosis not present

## 2022-09-04 MED ORDER — SODIUM CHLORIDE 0.9% FLUSH
10.0000 mL | INTRAVENOUS | Status: DC | PRN
  Administered 2022-09-04: 10 mL

## 2022-09-04 MED ORDER — HEPARIN SOD (PORK) LOCK FLUSH 100 UNIT/ML IV SOLN
500.0000 [IU] | Freq: Once | INTRAVENOUS | Status: AC | PRN
  Administered 2022-09-04: 500 [IU]

## 2022-09-04 NOTE — Patient Instructions (Signed)

## 2022-09-30 ENCOUNTER — Inpatient Hospital Stay: Payer: Medicare PPO

## 2022-09-30 LAB — CMP (CANCER CENTER ONLY)
ALT: 31 U/L (ref 0–44)
AST: 34 U/L (ref 15–41)
Albumin: 4 g/dL (ref 3.5–5.0)
Alkaline Phosphatase: 82 U/L (ref 38–126)
Anion gap: 8 (ref 5–15)
BUN: 14 mg/dL (ref 8–23)
CO2: 25 mmol/L (ref 22–32)
Calcium: 9 mg/dL (ref 8.9–10.3)
Chloride: 105 mmol/L (ref 98–111)
Creatinine: 0.94 mg/dL (ref 0.61–1.24)
GFR, Estimated: >60 mL/min (ref >60)
Glucose, Bld: 149 mg/dL — ABNORMAL HIGH (ref 70–99)
Potassium: 4.2 mmol/L (ref 3.5–5.1)
Sodium: 138 mmol/L (ref 135–145)
Total Bilirubin: 0.9 mg/dL (ref 0.3–1.2)
Total Protein: 6.4 g/dL — ABNORMAL LOW (ref 6.5–8.1)

## 2022-09-30 LAB — CBC WITH DIFFERENTIAL (CANCER CENTER ONLY)
Abs Immature Granulocytes: 0.01 10*3/uL (ref 0.00–0.07)
Basophils Absolute: 0.1 10*3/uL (ref 0.0–0.1)
Basophils Relative: 1 %
Eosinophils Absolute: 0.4 10*3/uL (ref 0.0–0.5)
Eosinophils Relative: 8 %
HCT: 43.2 % (ref 39.0–52.0)
Hemoglobin: 15.5 g/dL (ref 13.0–17.0)
Immature Granulocytes: 0 %
Lymphocytes Relative: 28 %
Lymphs Abs: 1.3 10*3/uL (ref 0.7–4.0)
MCH: 34.1 pg — ABNORMAL HIGH (ref 26.0–34.0)
MCHC: 35.9 g/dL (ref 30.0–36.0)
MCV: 94.9 fL (ref 80.0–100.0)
Monocytes Absolute: 0.5 10*3/uL (ref 0.1–1.0)
Monocytes Relative: 10 %
Neutro Abs: 2.5 10*3/uL (ref 1.7–7.7)
Neutrophils Relative %: 53 %
Platelet Count: 199 10*3/uL (ref 150–400)
RBC: 4.55 MIL/uL (ref 4.22–5.81)
RDW: 11.5 % (ref 11.5–15.5)
WBC Count: 4.7 10*3/uL (ref 4.0–10.5)
nRBC: 0 % (ref 0.0–0.2)

## 2022-09-30 LAB — IRON AND TIBC
Iron: 168 ug/dL (ref 45–182)
Saturation Ratios: 58 % — ABNORMAL HIGH (ref 17.9–39.5)
TIBC: 288 ug/dL (ref 250–450)
UIBC: 120 ug/dL

## 2022-09-30 LAB — FERRITIN: Ferritin: 29 ng/mL (ref 24–336)

## 2022-10-01 ENCOUNTER — Ambulatory Visit: Payer: Medicare PPO | Admitting: Oncology

## 2022-10-06 NOTE — Progress Notes (Signed)
[facility]  [address] [place],  [place]  [address] [phone]  Clinic Day:  10/07/2022  Referring physician: Maris Berger, MD  HISTORY OF PRESENT ILLNESS:  The patient is a 72 y.o. 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  He received monthly phlebotomies over these past months to get his iron parameters back under some semblance of control.  He comes in today for routine follow-up.  Since his last visit, the patient has been doing well.  He denies having any myalgias or other systemic symptoms which concern him for complications related to his underlying hemochromatosis.    PHYSICAL EXAM:  Blood pressure (!) 181/94, pulse (!) 48, temperature 98 F (36.7 C), resp. rate 16, height 5\' 11"  (1.803 m), weight 206 lb 3.2 oz (93.5 kg), SpO2 95 %. Wt Readings from Last 3 Encounters:  10/07/22 206 lb 3.2 oz (93.5 kg)  05/26/22 201 lb (91.2 kg)  01/23/22 204 lb 12.8 oz (92.9 kg)   Body mass index is 28.76 kg/m. Performance status (ECOG): 0 - Asymptomatic Physical Exam Constitutional:      Appearance: Normal appearance. He is not ill-appearing.  HENT:     Mouth/Throat:     Mouth: Mucous membranes are moist.     Pharynx: Oropharynx is clear. No oropharyngeal exudate or posterior oropharyngeal erythema.  Cardiovascular:     Rate and Rhythm: Regular rhythm.     Heart sounds: No murmur heard.    No friction rub. No gallop.  Pulmonary:     Effort: Pulmonary effort is normal. No respiratory distress.     Breath sounds: Normal breath sounds. No wheezing, rhonchi or rales.  Abdominal:     General: Bowel sounds are normal. There is no distension.     Palpations: Abdomen is soft. There is no mass.     Tenderness: There is no abdominal tenderness.  Musculoskeletal:        General: No swelling.     Right lower leg: No edema.     Left lower leg: No edema.  Lymphadenopathy:     Cervical: No cervical adenopathy.     Upper Body:     Right upper body: No  supraclavicular or axillary adenopathy.     Left upper body: No supraclavicular or axillary adenopathy.     Lower Body: No right inguinal adenopathy. No left inguinal adenopathy.  Skin:    General: Skin is warm.     Coloration: Skin is not jaundiced.     Findings: No lesion or rash.  Neurological:     General: No focal deficit present.     Mental Status: He is alert and oriented to person, place, and time. Mental status is at baseline.  Psychiatric:        Mood and Affect: Mood normal.        Behavior: Behavior normal.        Thought Content: Thought content normal.    LABS:      Latest Ref Rng & Units 09/30/2022   10:26 AM 05/25/2022    9:23 AM 01/22/2022   12:00 AM  CBC  WBC 4.0 - 10.5 K/uL 4.7  4.5  4.3      Hemoglobin 13.0 - 17.0 g/dL 15.5  16.0  15.2      Hematocrit 39.0 - 52.0 % 43.2  44.2  43      Platelets 150 - 400 K/uL 199  220  184         This result is  from an external source.      Latest Ref Rng & Units 09/30/2022   10:26 AM 01/22/2022   12:00 AM 05/22/2021   12:00 AM  CMP  Glucose 70 - 99 mg/dL 149     BUN 8 - 23 mg/dL 14  20     16       Creatinine 0.61 - 1.24 mg/dL 0.94  0.8     0.9      Sodium 135 - 145 mmol/L 138  136     138      Potassium 3.5 - 5.1 mmol/L 4.2  4.3     4.4      Chloride 98 - 111 mmol/L 105  106     105      CO2 22 - 32 mmol/L 25  21     26       Calcium 8.9 - 10.3 mg/dL 9.0  8.7     8.7      Total Protein 6.5 - 8.1 g/dL 6.4     Total Bilirubin 0.3 - 1.2 mg/dL 0.9     Alkaline Phos 38 - 126 U/L 82  123     103      AST 15 - 41 U/L 34  36     49      ALT 0 - 44 U/L 31  31     43         This result is from an external source.    Latest Reference Range & Units 09/30/22 10:26  Iron 45 - 182 ug/dL 168  UIBC ug/dL 120  TIBC 250 - 450 ug/dL 288  Saturation Ratios 17.9 - 39.5 % 58 (H)  Ferritin 24 - 336 ng/mL 29  (H): Data is abnormally high  ASSESSMENT & PLAN:  Assessment/Plan:  A 72 y.o. 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   When evaluating his most recent iron parameters, I am pleased as his ferritin is below 50.  As his serum iron and iron saturation are somewhat elevated, he will be phlebotomized this week.  Otherwise, I will see him back in 4 months to reassess his iron parameters as it pertains to his underlying hemochromatosis.  The patient understands all the plans discussed today and is in agreement with them.    Shatera Rennert Macarthur Critchley, MD

## 2022-10-07 ENCOUNTER — Inpatient Hospital Stay: Payer: Medicare PPO | Attending: Oncology | Admitting: Oncology

## 2022-10-08 ENCOUNTER — Inpatient Hospital Stay: Payer: Medicare PPO

## 2022-10-08 NOTE — Progress Notes (Signed)
Terry Bolton presents today for phlebotomy per MD orders. Phlebotomy procedure started at 1401 and ended at 1414. 494 grams removed. Patient observed for 5 minutes after procedure without any incident. Patient tolerated procedure well. IV needle removed intact. LEFT AC

## 2023-01-29 ENCOUNTER — Other Ambulatory Visit: Payer: Self-pay | Admitting: Hematology and Oncology

## 2023-01-29 NOTE — Progress Notes (Unsigned)
[facility]  [address] [place],  [place]  [address] [phone]  Clinic Day:  02/05/2023  Referring physician: Everlean Cherry, MD   HISTORY OF PRESENT ILLNESS:  The patient is a 72 y.o. 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  He receives routine phlebotomies to keep his iron parameters back under control.  He comes in today for routine follow-up.  Since his last visit, the patient has been doing well. He reports muscle cramping after working long hours, for which he is taking magnesium, milk thistle and another supplement with improvement. He otherwise denies having any myalgias or other systemic symptoms which concern him for complications related to his underlying hemochromatosis.    PHYSICAL EXAM:  Blood pressure (!) 143/80, pulse (!) 55, temperature 98 F (36.7 C), resp. rate 18, height 5\' 11"  (1.803 m), weight 202 lb 6.4 oz (91.8 kg), SpO2 97%. Wt Readings from Last 3 Encounters:  02/05/23 202 lb 6.4 oz (91.8 kg)  10/07/22 206 lb 3.2 oz (93.5 kg)  05/26/22 201 lb (91.2 kg)   Body mass index is 28.23 kg/m.  Performance status (ECOG): 1 - Symptomatic but completely ambulatory  Physical Exam Vitals and nursing note reviewed.  Constitutional:      General: He is not in acute distress.    Appearance: Normal appearance. He is normal weight.  HENT:     Head: Normocephalic and atraumatic.     Mouth/Throat:     Mouth: Mucous membranes are moist.     Pharynx: Oropharynx is clear. No oropharyngeal exudate or posterior oropharyngeal erythema.  Eyes:     General: No scleral icterus.    Extraocular Movements: Extraocular movements intact.     Conjunctiva/sclera: Conjunctivae normal.     Pupils: Pupils are equal, round, and reactive to light.  Cardiovascular:     Rate and Rhythm: Normal rate and regular rhythm.     Heart sounds: Normal heart sounds. No murmur heard.    No friction rub. No gallop.  Pulmonary:     Effort:  Pulmonary effort is normal.     Breath sounds: Normal breath sounds. No wheezing, rhonchi or rales.  Abdominal:     General: Bowel sounds are normal. There is no distension.     Palpations: Abdomen is soft. There is no hepatomegaly, splenomegaly or mass.     Tenderness: There is no abdominal tenderness.  Musculoskeletal:        General: Normal range of motion.     Cervical back: Normal range of motion and neck supple. No tenderness.     Right lower leg: No edema.     Left lower leg: No edema.  Lymphadenopathy:     Cervical: No cervical adenopathy.     Upper Body:     Right upper body: No supraclavicular or axillary adenopathy.     Left upper body: No supraclavicular or axillary adenopathy.     Lower Body: No right inguinal adenopathy. No left inguinal adenopathy.  Skin:    General: Skin is warm and dry.     Coloration: Skin is not jaundiced.     Findings: No rash.  Neurological:     Mental Status: He is alert and oriented to person, place, and time.     Cranial Nerves: No cranial nerve deficit.  Psychiatric:        Mood and Affect: Mood normal.        Behavior: Behavior normal.        Thought  Content: Thought content normal.    LABS:      Latest Ref Rng & Units 02/04/2023    9:16 AM 09/30/2022   10:26 AM 05/25/2022    9:23 AM  CBC  WBC 4.0 - 10.5 K/uL 4.6  4.7  4.5   Hemoglobin 13.0 - 17.0 g/dL 16.1  09.6  04.5   Hematocrit 39.0 - 52.0 % 41.7  43.2  44.2   Platelets 150 - 400 K/uL 218  199  220       Latest Ref Rng & Units 02/04/2023    9:16 AM 09/30/2022   10:26 AM 01/22/2022   12:00 AM  CMP  Glucose 70 - 99 mg/dL 409  811    BUN 8 - 23 mg/dL 14  14  20       Creatinine 0.61 - 1.24 mg/dL 9.14  7.82  0.8      Sodium 135 - 145 mmol/L 137  138  136      Potassium 3.5 - 5.1 mmol/L 4.5  4.2  4.3      Chloride 98 - 111 mmol/L 105  105  106      CO2 22 - 32 mmol/L 22  25  21       Calcium 8.9 - 10.3 mg/dL 8.8  9.0  8.7      Total Protein 6.5 - 8.1 g/dL 6.8  6.4    Total  Bilirubin 0.3 - 1.2 mg/dL 1.7  0.9    Alkaline Phos 38 - 126 U/L 80  82  123      AST 15 - 41 U/L 41  34  36      ALT 0 - 44 U/L 35  31  31         This result is from an external source.     No results found for: "CEA1", "CEA" / No results found for: "CEA1", "CEA" No results found for: "PSA1" No results found for: "NFA213" No results found for: "CAN125"  No results found for: "TOTALPROTELP", "ALBUMINELP", "A1GS", "A2GS", "BETS", "BETA2SER", "GAMS", "MSPIKE", "SPEI" Lab Results  Component Value Date   TIBC 258 02/04/2023   TIBC 288 09/30/2022   TIBC 273 05/25/2022   FERRITIN 117 02/04/2023   FERRITIN 29 09/30/2022   FERRITIN 55 05/25/2022   IRONPCTSAT 84 (H) 02/04/2023   IRONPCTSAT 58 (H) 09/30/2022   IRONPCTSAT 80 (H) 05/25/2022   No results found for: "LDH"  No results found for: "AFPTUMOR", "TOTALPROTELP", "ALBUMINELP", "A1GS", "A2GS", "BETS", "BETA2SER", "GAMS", "MSPIKE", "SPEI", "LDH", "CEA1", "CEA", "PSA1", "IGASERUM", "IGGSERUM", "IGMSERUM", "THGAB", "THYROGLB"  Review Flowsheet  More data exists      Latest Ref Rng & Units 05/25/2022 09/30/2022 02/04/2023  Oncology Labs  Ferritin 24 - 336 ng/mL 55  29  117   %SAT 17.9 - 39.5 % 80  58  84     Details             STUDIES:  No results found.    ASSESSMENT & PLAN:   Assessment/Plan:  72 y.o. 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  When evaluating his most recent iron parameters, his ferritin is above 50 and his other iron parameters are also higher than what we would like for them to be.  Based upon this, I will arrange for him to be phlebotomized monthly for these next 4 months.  We will see him back in 4 months to reassess his iron parameters to see how well he responded to his upcoming monthly phlebotomies.  The patient understands all the plans discussed today and is in agreement with them.  He knows to contact our office if he develops concerns prior to his next appointment.     Adah Perl,  PA-C   Physician Assistant [facility] [place] [phone]

## 2023-02-04 ENCOUNTER — Inpatient Hospital Stay: Payer: Medicare PPO | Attending: Hematology and Oncology

## 2023-02-04 DIAGNOSIS — R252 Cramp and spasm: Secondary | ICD-10-CM | POA: Insufficient documentation

## 2023-02-04 DIAGNOSIS — Z79899 Other long term (current) drug therapy: Secondary | ICD-10-CM | POA: Insufficient documentation

## 2023-02-04 LAB — CBC WITH DIFFERENTIAL (CANCER CENTER ONLY)
Abs Immature Granulocytes: 0.01 10*3/uL (ref 0.00–0.07)
Basophils Absolute: 0.1 10*3/uL (ref 0.0–0.1)
Basophils Relative: 1 %
Eosinophils Absolute: 0.3 10*3/uL (ref 0.0–0.5)
Eosinophils Relative: 5 %
HCT: 41.7 % (ref 39.0–52.0)
Hemoglobin: 15.1 g/dL (ref 13.0–17.0)
Immature Granulocytes: 0 %
Lymphocytes Relative: 32 %
Lymphs Abs: 1.5 10*3/uL (ref 0.7–4.0)
MCH: 34.4 pg — ABNORMAL HIGH (ref 26.0–34.0)
MCHC: 36.2 g/dL — ABNORMAL HIGH (ref 30.0–36.0)
MCV: 95 fL (ref 80.0–100.0)
Monocytes Absolute: 0.5 10*3/uL (ref 0.1–1.0)
Monocytes Relative: 10 %
Neutro Abs: 2.4 10*3/uL (ref 1.7–7.7)
Neutrophils Relative %: 52 %
Platelet Count: 218 10*3/uL (ref 150–400)
RBC: 4.39 MIL/uL (ref 4.22–5.81)
RDW: 12.2 % (ref 11.5–15.5)
WBC Count: 4.6 10*3/uL (ref 4.0–10.5)
nRBC: 0 % (ref 0.0–0.2)

## 2023-02-04 LAB — CMP (CANCER CENTER ONLY)
ALT: 35 U/L (ref 0–44)
AST: 41 U/L (ref 15–41)
Albumin: 3.9 g/dL (ref 3.5–5.0)
Alkaline Phosphatase: 80 U/L (ref 38–126)
Anion gap: 10 (ref 5–15)
BUN: 14 mg/dL (ref 8–23)
CO2: 22 mmol/L (ref 22–32)
Calcium: 8.8 mg/dL — ABNORMAL LOW (ref 8.9–10.3)
Chloride: 105 mmol/L (ref 98–111)
Creatinine: 1.03 mg/dL (ref 0.61–1.24)
GFR, Estimated: >60 mL/min (ref >60)
Glucose, Bld: 216 mg/dL — ABNORMAL HIGH (ref 70–99)
Potassium: 4.5 mmol/L (ref 3.5–5.1)
Sodium: 137 mmol/L (ref 135–145)
Total Bilirubin: 1.7 mg/dL — ABNORMAL HIGH (ref 0.3–1.2)
Total Protein: 6.8 g/dL (ref 6.5–8.1)

## 2023-02-04 LAB — IRON AND TIBC
Iron: 215 ug/dL — ABNORMAL HIGH (ref 45–182)
Saturation Ratios: 84 % — ABNORMAL HIGH (ref 17.9–39.5)
TIBC: 258 ug/dL (ref 250–450)
UIBC: 43 ug/dL

## 2023-02-04 LAB — FERRITIN: Ferritin: 117 ng/mL (ref 24–336)

## 2023-02-05 ENCOUNTER — Encounter: Payer: Self-pay | Admitting: Hematology and Oncology

## 2023-02-05 ENCOUNTER — Inpatient Hospital Stay: Payer: Medicare PPO | Admitting: Hematology and Oncology

## 2023-02-06 ENCOUNTER — Encounter: Payer: Self-pay | Admitting: Oncology

## 2023-02-08 ENCOUNTER — Inpatient Hospital Stay: Payer: Medicare PPO

## 2023-02-08 ENCOUNTER — Encounter: Payer: Self-pay | Admitting: Oncology

## 2023-02-08 MED ORDER — ALTEPLASE 2 MG IJ SOLR
2.0000 mg | Freq: Once | INTRAMUSCULAR | Status: AC
  Administered 2023-02-08: 2 mg

## 2023-02-08 MED ORDER — SODIUM CHLORIDE 0.9% FLUSH
10.0000 mL | Freq: Once | INTRAVENOUS | Status: AC
  Administered 2023-02-08: 10 mL

## 2023-02-08 MED ORDER — HEPARIN SOD (PORK) LOCK FLUSH 100 UNIT/ML IV SOLN
500.0000 [IU] | Freq: Once | INTRAVENOUS | Status: AC
  Administered 2023-02-08: 500 [IU]

## 2023-02-08 NOTE — Progress Notes (Signed)
Terry Bolton presents today for phlebotomy per MD orders. Phlebotomy procedure started at 1500 and ended at 1515. 480 ML removed. Patient observed for 10 minutes after procedure without any incident. Patient tolerated procedure well. IV needle removed intact.  INITIALLY NO BLOOD RETURN FROM PAC, SO INSTILLED CATHFLO AND WAITED 30 MINUTES.  AFTER 30 MINUTES, GOOD BLOOD RETURN AND NO PROBLEMS WITH PHLEBOTOMY

## 2023-02-08 NOTE — Patient Instructions (Signed)

## 2023-03-11 ENCOUNTER — Encounter: Payer: Self-pay | Admitting: Oncology

## 2023-03-11 ENCOUNTER — Inpatient Hospital Stay: Payer: Medicare PPO | Attending: Hematology and Oncology

## 2023-03-11 DIAGNOSIS — Z79899 Other long term (current) drug therapy: Secondary | ICD-10-CM | POA: Insufficient documentation

## 2023-03-11 MED ORDER — HEPARIN SOD (PORK) LOCK FLUSH 100 UNIT/ML IV SOLN: 500.0000 [IU] | Freq: Once | INTRAVENOUS | Status: DC

## 2023-03-11 MED ORDER — SODIUM CHLORIDE 0.9% FLUSH: 10.0000 mL | Freq: Once | INTRAVENOUS | Status: DC

## 2023-03-11 NOTE — Progress Notes (Signed)
Terry Bolton presents today for phlebotomy per MD orders. Phlebotomy procedure started at 1401 and ended at 1415. 480 mL removed. Patient observed for 30 minutes after procedure without any incident. Patient tolerated procedure well. IV needle removed intact. HUBER NEEDLE #19 ga used.  PAC worked good with no problems with blood return

## 2023-03-11 NOTE — Patient Instructions (Signed)

## 2023-03-23 ENCOUNTER — Telehealth: Payer: Self-pay

## 2023-03-23 DIAGNOSIS — G720 Drug-induced myopathy: Secondary | ICD-10-CM | POA: Insufficient documentation

## 2023-03-23 DIAGNOSIS — T466X5A Adverse effect of antihyperlipidemic and antiarteriosclerotic drugs, initial encounter: Secondary | ICD-10-CM

## 2023-03-23 HISTORY — DX: Drug-induced myopathy: G72.0

## 2023-03-23 HISTORY — DX: Drug-induced myopathy: T46.6X5A

## 2023-03-23 NOTE — Telephone Encounter (Signed)
THE PATIENT CALLED AND CHANGED HIS 04/12/23 APPT TO 04/19/23 AT 1400.

## 2023-04-19 ENCOUNTER — Inpatient Hospital Stay: Payer: Medicare PPO | Attending: Hematology and Oncology

## 2023-04-19 NOTE — Progress Notes (Signed)
DR. Melvyn Neth GAVE ORDERS TO PROCEED WITH PHLEBOTOMY TODAY. ORDERS PUT IN PER DR. LEWIS VIA TELEPHONE AND READ BACK PER PROTOCOL.  Terry Bolton presents today for phlebotomy per MD orders. Phlebotomy procedure started at 1436 and ended at 1442. 500 grams removed. Patient observed for 30 minutes after procedure without any incident. Patient tolerated procedure well. IV needle removed intact.

## 2023-04-19 NOTE — Patient Instructions (Signed)

## 2023-04-28 ENCOUNTER — Encounter: Payer: Self-pay | Admitting: Cardiology

## 2023-04-28 ENCOUNTER — Other Ambulatory Visit: Payer: Self-pay

## 2023-04-28 ENCOUNTER — Encounter: Payer: Self-pay | Admitting: *Deleted

## 2023-04-28 DIAGNOSIS — M1990 Unspecified osteoarthritis, unspecified site: Secondary | ICD-10-CM | POA: Insufficient documentation

## 2023-04-28 DIAGNOSIS — E1142 Type 2 diabetes mellitus with diabetic polyneuropathy: Secondary | ICD-10-CM | POA: Insufficient documentation

## 2023-04-28 DIAGNOSIS — R9431 Abnormal electrocardiogram [ECG] [EKG]: Secondary | ICD-10-CM | POA: Insufficient documentation

## 2023-04-28 DIAGNOSIS — E119 Type 2 diabetes mellitus without complications: Secondary | ICD-10-CM | POA: Insufficient documentation

## 2023-04-28 DIAGNOSIS — Z9889 Other specified postprocedural states: Secondary | ICD-10-CM | POA: Insufficient documentation

## 2023-04-28 DIAGNOSIS — M109 Gout, unspecified: Secondary | ICD-10-CM | POA: Insufficient documentation

## 2023-04-28 DIAGNOSIS — E139 Other specified diabetes mellitus without complications: Secondary | ICD-10-CM | POA: Insufficient documentation

## 2023-04-28 DIAGNOSIS — I209 Angina pectoris, unspecified: Secondary | ICD-10-CM | POA: Insufficient documentation

## 2023-04-28 HISTORY — DX: Other specified diabetes mellitus without complications: E13.9

## 2023-04-28 HISTORY — DX: Type 2 diabetes mellitus with diabetic polyneuropathy: E11.42

## 2023-04-28 NOTE — Progress Notes (Unsigned)
Cardiology Office Note:    Date:  04/29/2023   ID:  Terry Bolton, DOB 1951-02-24, MRN [id number]  PCP:  Everlean Cherry, MD  Cardiologist:  Norman Herrlich, MD   Referring MD: Everlean Cherry, MD  ASSESSMENT:    1. APC (atrial premature contractions)   2. Abnormal EKG   3. Abnormal electrocardiogram   4. Hypertension, essential   5. Hemochromatosis, hereditary (HCC)   6. Chest pain of uncertain etiology    PLAN:    In order of problems listed above:  The principal issue is frequent APCs asymptomatic on a wellness screening EKG.  It is always difficult to say the meaning of APCs on EKG in an asymptomatic man but increased cardiovascular risk with his diabetes having chest pain and hemochromatosis that can be associated with cardiomyopathy He is at risk for cardiomyopathy with hemochromatosis echocardiogram ordered looking for findings of restrictive cardiomyopathy 1 weeks ZIO monitor to define if he has significant or concerning atrial arrhythmia Cardiac CTA to evaluate his chest pain will see if we can do this before he leaves for his mission trip to [place] Continue his current antihypertensive medication and home monitoring  Next appointment 3 months   Medication Adjustments/Labs and Tests Ordered: Current medicines are reviewed at length with the patient today.  Concerns regarding medicines are outlined above.  Orders Placed This Encounter  Procedures   CT CORONARY MORPH W/CTA COR W/SCORE W/CA W/CM &/OR WO/CM   Basic Metabolic Panel (BMET)   LONG TERM MONITOR (3-14 DAYS)   EKG 12-Lead   ECHOCARDIOGRAM COMPLETE   Meds ordered this encounter  Medications   metoprolol tartrate (LOPRESSOR) 100 MG tablet    Sig: Take 1 tablet (100 mg total) by mouth once for 1 dose. Please take this medication 2 hours before CT.    Dispense:  1 tablet    Refill:  0     History of Present Illness:    Terry Bolton is a 72 y.o. male who is being seen today for the evaluation  of abnormal EKG at the request of Everlean Cherry, MD.  Treadmill myocardial perfusion study performed at [facility] 07/24/2015 exercise tolerance was well-preserved 10.8 METS EKG response was normal blood pressure response was normal exercise tolerance was normal.  It occurred during a hospitalization for chest pain.  Medical problems include hemochromatosis type 2 diabetes dyslipidemia hypertension and gout.  The myocardial perfusion images showed normal perfusion function EF 62% EKG in the hospital showed sinus bradycardia otherwise normal.  03/23/2023 showed sinus rhythm with atrial bigeminy left axis deviation normal QT interval and no ischemic changes or signs of infarction.  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 He had no awareness of atrial premature contractions he checks blood pressure at home heart rates run in the high 50s and he never has a visual alarm for irregular heart rhythm Has no known history of heart disease congenital rheumatic or atrial fibrillation He is a very vigorous active man but is concerned because he has had episodes  of back and chest discomfort tends to occur at night at all last for 10 or 15 minutes he describes it as dull deep-seated discomfort that resolved spontaneously.  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 He  is not having edema shortness of breath orthopnea palpitation or syncope Recent lipid profile cholesterol 184 triglycerides 317 LDL 102 HDL 40.  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   Hypertension, essential 08/08/2015   OA (osteoarthritis) of hip 10/25/2019   OSA (obstructive sleep apnea) 09/10/2015   Pain of left hip joint 09/07/2019   Pigment cirrhosis (HCC)    PONV (postoperative nausea and vomiting)    Posterior tibial tendonitis, left 12/16/2017   Statin declined 09/17/2017   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   Tightness of heel cord, left 12/16/2017   Type 2 diabetes mellitus with diabetic polyneuropathy, 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/2023    Past Surgical History:  Procedure Laterality Date   CHOLECYSTECTOMY     HAND LIGAMENT RECONSTRUCTION Left    PORTACATH PLACEMENT Right    TOTAL HIP ARTHROPLASTY Left 10/25/2019   Procedure: TOTAL HIP ARTHROPLASTY ANTERIOR APPROACH; CYSTOSCOPY WITH URETHRAL CATHETER INSERTION;  Surgeon: Ollen Gross, MD;  Location: [facility] ORS;  Service: Orthopedics;  Laterality: Left;     Current Medications: Current Meds  Medication Sig   albuterol (VENTOLIN HFA) 108 (90 Base) MCG/ACT inhaler Inhale 1-2 puffs into the lungs as needed for wheezing or shortness of breath.   doxycycline (VIBRAMYCIN) 100 MG capsule Take 100 mg by mouth 2 (two) times daily.   lisinopril (ZESTRIL) 10 MG tablet Take 1 tablet by mouth daily.   MAGNESIUM CITRATE PO Take 250 mg by mouth daily.   metFORMIN  (GLUCOPHAGE-XR) 500 MG 24 hr tablet Take 500 mg by mouth at bedtime.   metoprolol tartrate (LOPRESSOR) 100 MG tablet Take 1 tablet (100 mg total) by mouth once for 1 dose. Please take this medication 2 hours before CT.   milk thistle 175 MG tablet Take 175 mg by mouth daily.   Multiple Vitamin (MULTIVITAMIN) capsule Take 1 capsule by mouth daily.   naproxen (NAPROSYN) 250 MG tablet Take 250 mg by mouth as needed for mild pain (pain score 1-3) or moderate pain (pain score 4-6).     Allergies:   Amoxicillin   Social History   Socioeconomic History   Marital status: Married    Spouse name: Not on file   Number of children: Not on file   Years of education: Not on file   Highest education level: Not on file  Occupational History   Not on file  Tobacco Use   Smoking status: Former    Types: Cigarettes   Smokeless tobacco: Never  Vaping Use   Vaping status: Never Used  Substance and Sexual Activity   Alcohol use: Never  Drug use: Never   Sexual activity: Not on file  Other Topics Concern   Not on file  Social History Narrative   Not on file   Social Determinants of Health   Financial Resource Strain: Not on file  Food Insecurity: Not on file  Transportation Needs: Not on file  Physical Activity: Not on file  Stress: Not on file  Social Connections: Not on file     Family History: The patient's family history includes Diabetes in his maternal aunt; Luiz Blare' disease in his sister; Heart disease in his father; Thyroid disease in his sister.  ROS:   ROS Please see the history of present illness.     All other systems reviewed and are negative.  EKGs/Labs/Other Studies Reviewed:    The following studies were reviewed today:  EKG Interpretation Date/Time:  Thursday April 29 2023 10:38:38 EDT Ventricular Rate:  67 PR Interval:  154 QRS Duration:  86 QT Interval:  406 QTC Calculation: 429 R Axis:   -28  Text Interpretation: Sinus rhythm with marked sinus arrhythmia  Minimal voltage criteria for LVH, may be normal variant ABNORMAL R WAVE PROGRESSION No previous ECGs available Confirmed by Norman Herrlich ([id number]) on 04/29/2023 10:54:38 AM   Recent Labs: 02/04/2023: ALT 35; BUN 14; Creatinine 1.03; Hemoglobin 15.1; Platelet Count 218; Potassium 4.5; Sodium 137    Physical Exam:    VS:  BP (!) 156/88   Pulse 67   Ht 6' (1.829 m)   Wt 201 lb 3.2 oz (91.3 kg)   SpO2 96%   BMI 27.29 kg/m     Wt Readings from Last 3 Encounters:  04/29/23 201 lb 3.2 oz (91.3 kg)  03/23/23 199 lb (90.3 kg)  04/19/23 205 lb 4 oz (93.1 kg)     GEN:  Well nourished, well developed in no acute distress HEENT: Normal NECK: No JVD; No carotid bruits LYMPHATICS: No lymphadenopathy CARDIAC: RRR, no murmurs, rubs, gallops RESPIRATORY:  Clear to auscultation without rales, wheezing or rhonchi  ABDOMEN: Soft, non-tender, non-distended MUSCULOSKELETAL:  No edema; No deformity  SKIN: Warm and dry NEUROLOGIC:  Alert and oriented x 3 PSYCHIATRIC:  Normal affect     Signed, Norman Herrlich, MD  04/29/2023 12:06 PM    [facility]

## 2023-04-29 ENCOUNTER — Encounter: Payer: Self-pay | Admitting: Cardiology

## 2023-04-29 ENCOUNTER — Ambulatory Visit: Payer: Medicare PPO | Attending: Cardiology

## 2023-04-29 ENCOUNTER — Ambulatory Visit: Payer: Medicare PPO | Attending: Cardiology | Admitting: Cardiology

## 2023-04-29 VITALS — BP 156/88 | HR 67 | Ht 72.0 in | Wt 201.2 lb

## 2023-04-29 DIAGNOSIS — R9431 Abnormal electrocardiogram [ECG] [EKG]: Secondary | ICD-10-CM

## 2023-04-29 DIAGNOSIS — I10 Essential (primary) hypertension: Secondary | ICD-10-CM

## 2023-04-29 DIAGNOSIS — R079 Chest pain, unspecified: Secondary | ICD-10-CM

## 2023-04-29 DIAGNOSIS — I491 Atrial premature depolarization: Secondary | ICD-10-CM

## 2023-04-29 MED ORDER — METOPROLOL TARTRATE 100 MG PO TABS: 100.0000 mg | ORAL_TABLET | Freq: Once | ORAL | 0 refills | Status: DC

## 2023-04-29 NOTE — Patient Instructions (Signed)
Medication Instructions:  Your physician recommends that you continue on your current medications as directed. Please refer to the Current Medication list given to you today.  *If you need a refill on your cardiac medications before your next appointment, please call your pharmacy*   Lab Work: Your physician recommends that you return for lab work in:   Labs today: BMP  If you have labs (blood work) drawn today and your tests are completely normal, you will receive your results only by: MyChart Message (if you have MyChart) OR A paper copy in the mail If you have any lab test that is abnormal or we need to change your treatment, we will call you to review the results.   Testing/Procedures: Your physician has requested that you have an echocardiogram. Echocardiography is a painless test that uses sound waves to create images of your heart. It provides your doctor with information about the size and shape of your heart and how well your heart's chambers and valves are working. This procedure takes approximately one hour. There are no restrictions for this procedure. Please do NOT wear cologne, perfume, aftershave, or lotions (deodorant is allowed). Please arrive 15 minutes prior to your appointment time.   A zio monitor was ordered today. It will remain on for 7 days. You will then return monitor and event diary in provided box. It takes 1-2 weeks for report to be downloaded and returned to [place]. We will call you with the results. If monitor falls off or has orange flashing light, please call Zio for further instructions    Your cardiac CT will be scheduled at one of the below locations:   [facility] [address] [place], [place] [address] [phone]  OR  [facility] [address] [place], [place] [address] [phone]  OR   [facility] [address] [place], [place] [address] [phone]  If scheduled at [facility], please arrive at the [facility] (Entrance C2) of [facility] 30 minutes prior to test start time. You can use the FREE valet parking offered at entrance C (encouraged to control the heart rate for the test)  Proceed to the [facility] Radiology Department (first floor) to check-in and test prep.  All radiology patients and guests should use entrance C2 at [facility], accessed from [facility], even though the hospital's physical address listed is [address]    If scheduled at [facility] or [facility], please arrive 15 mins early for check-in and test prep.  There is spacious parking and easy access to the radiology department from the [facility] entrance. Please enter here and check-in with the desk attendant.   Please follow these instructions carefully (unless otherwise directed):  An IV will be required for this test and Nitroglycerin will be given.  Hold all erectile dysfunction medications at least 3 days (72 hrs) prior to test. (Ie viagra, cialis, sildenafil, tadalafil, etc)   On the Night Before the Test: Be sure to Drink plenty of water. Do not consume any caffeinated/decaffeinated beverages or chocolate 12 hours prior to your test. Do not take any antihistamines 12 hours prior to your test.  On the Day of the Test: Drink plenty of water until 1 hour prior to the test. Do not eat any food 1 hour prior to test. You may take your regular medications prior  to the test.  Take metoprolol (Lopressor) two hours prior to test.      After the Test: Drink plenty of water. After receiving IV contrast, you may experience a mild flushed feeling. This is normal. On occasion, you may experience a mild rash up to 24 hours after the test. This is not dangerous. If this occurs, you can take Benadryl 25 mg and increase your  fluid intake. If you experience trouble breathing, this can be serious. If it is severe call 911 IMMEDIATELY. If it is mild, please call our office. If you take any of these medications: Glipizide/Metformin, Avandament, Glucavance, please do not take 48 hours after completing test unless otherwise instructed.  We will call to schedule your test 2-4 weeks out understanding that some insurance companies will need an authorization prior to the service being performed.   For more information and frequently asked questions, please visit our website : [email or web address]  For non-scheduling related questions, please contact the cardiac imaging nurse navigator should you have any questions/concerns: Cardiac Imaging Nurse Navigators Direct Office Dial: [phone]   For scheduling needs, including cancellations and rescheduling, please call [place], [phone].     Follow-Up: At [facility], you and your health needs are our priority.  As part of our continuing mission to provide you with exceptional heart care, we have created designated Provider Care Teams.  These Care Teams include your primary Cardiologist (physician) and Advanced Practice Providers (APPs -  Physician Assistants and Nurse Practitioners) who all work together to provide you with the care you need, when you need it.  We recommend signing up for the patient portal called "MyChart".  Sign up information is provided on this After Visit Summary.  MyChart is used to connect with patients for Virtual Visits (Telemedicine).  Patients are able to view lab/test results, encounter notes, upcoming appointments, etc.  Non-urgent messages can be sent to your provider as well.   To learn more about what you can do with MyChart, go to [email or web address].    Your next appointment:   3 month(s)  Provider:   Norman Herrlich, MD    Other Instructions None

## 2023-04-30 LAB — BASIC METABOLIC PANEL
BUN/Creatinine Ratio: 15 (ref 10–24)
BUN: 16 mg/dL (ref 8–27)
CO2: 21 mmol/L (ref 20–29)
Calcium: 9.5 mg/dL (ref 8.6–10.2)
Chloride: 102 mmol/L (ref 96–106)
Creatinine, Ser: 1.08 mg/dL (ref 0.76–1.27)
Glucose: 135 mg/dL — ABNORMAL HIGH (ref 70–99)
Potassium: 4.5 mmol/L (ref 3.5–5.2)
Sodium: 141 mmol/L (ref 134–144)
eGFR: 73 mL/min/{1.73_m2} (ref >59)

## 2023-05-13 ENCOUNTER — Inpatient Hospital Stay: Payer: Medicare PPO | Attending: Hematology and Oncology

## 2023-05-13 ENCOUNTER — Other Ambulatory Visit: Payer: Self-pay | Admitting: Hematology and Oncology

## 2023-05-13 ENCOUNTER — Other Ambulatory Visit: Payer: Self-pay | Admitting: Oncology

## 2023-05-13 DIAGNOSIS — Z79899 Other long term (current) drug therapy: Secondary | ICD-10-CM | POA: Insufficient documentation

## 2023-05-13 NOTE — Progress Notes (Signed)
Terry Bolton presents today for phlebotomy per MD orders. Phlebotomy procedure started at 1400 and ended at 1415. 485 grams removed. Patient observed for 30 minutes after procedure without any incident. Patient tolerated procedure well. IV needle removed intact.  RIGHT AC

## 2023-05-13 NOTE — Patient Instructions (Signed)

## 2023-05-14 ENCOUNTER — Other Ambulatory Visit (HOSPITAL_COMMUNITY): Payer: Medicare PPO

## 2023-05-18 ENCOUNTER — Other Ambulatory Visit (HOSPITAL_COMMUNITY): Payer: Medicare PPO

## 2023-05-18 ENCOUNTER — Ambulatory Visit (HOSPITAL_COMMUNITY): Admission: RE | Admit: 2023-05-18 | Payer: Medicare PPO | Source: Ambulatory Visit

## 2023-05-27 ENCOUNTER — Encounter (HOSPITAL_BASED_OUTPATIENT_CLINIC_OR_DEPARTMENT_OTHER): Payer: Self-pay

## 2023-05-27 ENCOUNTER — Ambulatory Visit (HOSPITAL_BASED_OUTPATIENT_CLINIC_OR_DEPARTMENT_OTHER)
Admission: EM | Admit: 2023-05-27 | Discharge: 2023-05-27 | Disposition: A | Discharge status: Home / Self Care | Payer: Medicare PPO | Attending: Internal Medicine | Admitting: Internal Medicine

## 2023-05-27 DIAGNOSIS — R42 Dizziness and giddiness: Secondary | ICD-10-CM | POA: Diagnosis not present

## 2023-05-27 NOTE — ED Notes (Signed)
Patient is being discharged from the Urgent Care and sent to the Emergency Department via POV . Per Angie,PA, patient is in need of higher level of care due to Dizziness. Patient is aware and verbalizes understanding of plan of care.  Vitals:   05/27/23 1128  BP: (!) 176/89  Pulse: (!) 53  Resp: 18  Temp: 97.7 F (36.5 C)  SpO2: 96%

## 2023-05-27 NOTE — Discharge Instructions (Signed)
Go to the emergency department for further evaluation

## 2023-05-27 NOTE — ED Provider Notes (Addendum)
[facility]    CSN: [id number] Arrival date & time: 05/27/23  1116      History   Chief Complaint Chief Complaint  Patient presents with   Dizziness    HPI Terry Bolton is a 72 y.o. male.    Dizziness Lightheadedness onset 2 to 3 days ago upon arising.  States when he wakes up he feels lightheaded it gradually resolves during the day.  Admits some blurred vision.  Admits occasional occipital headache when he wakes up, none currently.  Admits multiple episodes of nausea yesterday, states he felt unwell and needed to eat but felt better after eating.  He is diabetic states his blood sugar today was 130. Patient believes his symptoms are related to fluid in his left ear as he occasionally has a dull pain in the left ear.  Had similar symptoms several months ago related to fluid in the ears which he treated with an over-the-counter allergy medicine.  Has not been taking allergy medicines recently. Admits occasional nasal congestion and rhinorrhea. Denies drainage from ears, change in hearing, sore throat, cough, chest pain, palpitations, shortness of breath, abdominal pain, vomiting, diarrhea, urinary symptoms. Denies recent change in medications.  Was seen by cardiology recently for PACs, is scheduled to have an echocardiogram next week.  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   Hypertension, essential 08/08/2015   OA (osteoarthritis) of hip 10/25/2019   OSA (obstructive sleep apnea) 09/10/2015   Pain of left hip joint 09/07/2019   Pigment cirrhosis (HCC)    PONV (postoperative nausea and vomiting)    Posterior  tibial tendonitis, left 12/16/2017   Statin declined 09/17/2017   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   Tightness of heel cord, left 12/16/2017   Type 2 diabetes mellitus with diabetic polyneuropathy, 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/2023    Patient Active Problem List   Diagnosis Date Noted   Diabetes 1.5, 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   Gout 04/28/2023   Type 2 diabetes mellitus with diabetic polyneuropathy, 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/2023   Abnormal EKG    Anginal pain (HCC)    Arthritis    Diabetes mellitus without complication (HCC)    Hemochromatosis    Pigment cirrhosis (HCC)    PONV (postoperative nausea and vomiting)    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   Hemochromatosis, hereditary (HCC) 04/05/2020   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   OA (osteoarthritis) of hip 10/25/2019   Pain of left hip joint 09/07/2019   Arthritis of midtarsal joint of left foot 12/16/2017   Posterior tibial tendonitis, left 12/16/2017   Tightness of heel cord, left 12/16/2017   Drug therapy 11/26/2017   Statin declined 09/17/2017   OSA (obstructive sleep apnea) 09/10/2015   Hypertension, essential 08/08/2015    Past Surgical History:  Procedure Laterality Date   CHOLECYSTECTOMY     HAND LIGAMENT RECONSTRUCTION Left    PORTACATH PLACEMENT Right    TOTAL HIP ARTHROPLASTY Left 10/25/2019   Procedure: TOTAL HIP ARTHROPLASTY ANTERIOR APPROACH; CYSTOSCOPY WITH URETHRAL CATHETER INSERTION;  Surgeon:  Ollen Gross, MD;  Location: [facility];  Service: Orthopedics;  Laterality: Left;        Home Medications    Prior to Admission medications   Medication Sig Start Date End Date Taking? Authorizing Provider  albuterol (VENTOLIN HFA) 108 (90 Base) MCG/ACT inhaler Inhale 1-2 puffs into the lungs as needed for wheezing or shortness of breath. 03/10/21   [provider]  lisinopril (ZESTRIL) 10 MG tablet Take 1 tablet by mouth daily.  09/16/21   [provider]  MAGNESIUM CITRATE PO Take 250 mg by mouth daily.    [provider]  metFORMIN (GLUCOPHAGE-XR) 500 MG 24 hr tablet Take 500 mg by mouth at bedtime. 10/06/19   [provider]  metoprolol tartrate (LOPRESSOR) 100 MG tablet Take 1 tablet (100 mg total) by mouth once for 1 dose. Please take this medication 2 hours before CT. 04/29/23 04/29/23  Baldo Daub, MD  milk thistle 175 MG tablet Take 175 mg by mouth daily.    [provider]  Multiple Vitamin (MULTIVITAMIN) capsule Take 1 capsule by mouth daily.    [provider]  naproxen (NAPROSYN) 250 MG tablet Take 250 mg by mouth as needed for mild pain (pain score 1-3) or moderate pain (pain score 4-6).    [provider]    Family History Family History  Problem Relation Age of Onset   Heart disease Father    Thyroid disease Sister    Graves' disease Sister    Diabetes Maternal Aunt     Social History Social History   Tobacco Use   Smoking status: Former    Types: Cigarettes   Smokeless tobacco: Never  Vaping Use   Vaping status: Never Used  Substance Use Topics   Alcohol use: Never   Drug use: Never     Allergies   Amoxicillin   Review of Systems Review of Systems  Neurological:  Positive for dizziness.     Physical Exam Triage Vital Signs ED Triage Vitals  Encounter Vitals Group     BP 05/27/23 1128 (!) 176/89     Systolic BP Percentile --      Diastolic BP Percentile --      Pulse Rate 05/27/23 1128 (!) 53     Resp 05/27/23 1128 18     Temp 05/27/23 1128 97.7 F (36.5 C)     Temp Source 05/27/23 1128 Oral     SpO2 05/27/23 1128 96 %     Weight --      Height --      Head Circumference --      Peak Flow --      Pain Score 05/27/23 1129 0     Pain Loc --      Pain Education --      Exclude from Growth Chart --    No data found.  Updated Vital Signs BP (!) 176/89 (BP Location: Right Arm)   Pulse (!) 53   Temp 97.7 F  (36.5 C) (Oral)   Resp 18   SpO2 96%   Visual Acuity Right Eye Distance:   Left Eye Distance:   Bilateral Distance:    Right Eye Near:   Left Eye Near:    Bilateral Near:     Physical Exam Vitals and nursing note reviewed.  Constitutional:      Appearance: He is not ill-appearing or diaphoretic.  HENT:     Head: Normocephalic and atraumatic.     Right Ear:  Tympanic membrane and ear canal normal.     Left Ear: Tympanic membrane and ear canal normal.     Nose: No congestion or rhinorrhea.     Mouth/Throat:     Pharynx: Oropharynx is clear.  Eyes:     Conjunctiva/sclera: Conjunctivae normal.  Cardiovascular:     Rate and Rhythm: Regular rhythm. Bradycardia present.     Heart sounds: Normal heart sounds.  Pulmonary:     Effort: Pulmonary effort is normal. No respiratory distress.     Breath sounds: Normal breath sounds. No wheezing or rales.  Abdominal:     Palpations: Abdomen is soft.     Tenderness: There is no abdominal tenderness. There is no guarding.  Musculoskeletal:     Cervical back: Neck supple.  Skin:    General: Skin is warm and dry.     Coloration: Skin is not pale.  Neurological:     Mental Status: He is alert and oriented to person, place, and time.     Cranial Nerves: No cranial nerve deficit.     Motor: No weakness.     Coordination: Romberg sign positive. Finger-Nose-Finger Test normal.     Gait: Gait is intact.      UC Treatments / Results  Labs (all labs ordered are listed, but only abnormal results are displayed) Labs Reviewed - No data to display  EKG   Radiology No results found.  Procedures Procedures (including critical care time)  Medications Ordered in UC Medications - No data to display  Initial Impression / Assessment and Plan / UC Course  I have reviewed the triage vital signs and the nursing notes.  Pertinent labs & imaging results that were available during my care of the patient were reviewed by me and considered in  my medical decision making (see chart for details).     72 year old male with history of lightheadedness for 2 to 3 days worse upon rising associated with occipital headache, nausea and blurred vision.  He is well-appearing, mildly hypertensive at 176/89, he is bradycardic at 53, neuroexam is normal except for positive Romberg. Discussed with patient I do not see any evidence of effusion in his left ear which was his greatest concern, I have concerns given his symptoms of occipital headache, visual disturbance, nausea and positive Romberg recommend ED evaluation as he will likely quire higher level of testing than we can offer in our urgent care today. Final Clinical Impressions(s) / UC Diagnoses   Final diagnoses:  Lightheadedness     Discharge Instructions      Go to the emergency department for further evaluation   ED Prescriptions   None    PDMP not reviewed this encounter.   Meliton Rattan, PA 05/27/23 1158    Meliton Rattan, [place] 05/27/23 1200

## 2023-05-27 NOTE — ED Triage Notes (Signed)
Pt c/o dizziness after waking up x3 days. States hx of same 3 months ago and had fluid behind ear drum to lt ear. States has a dull pain to the ear at times. States hasn't took his allergy meds in 2-3 wks. Denies taking any meds.

## 2023-06-01 ENCOUNTER — Ambulatory Visit: Payer: Medicare PPO | Attending: Cardiology

## 2023-06-01 DIAGNOSIS — R9431 Abnormal electrocardiogram [ECG] [EKG]: Secondary | ICD-10-CM | POA: Diagnosis not present

## 2023-06-01 LAB — ECHOCARDIOGRAM COMPLETE
Area-P 1/2: 2.75 cm2
MV M vel: 3.61 m/s
MV Peak grad: 52.1 mm[Hg]
S' Lateral: 3.4 cm

## 2023-06-02 ENCOUNTER — Ambulatory Visit (HOSPITAL_COMMUNITY): Payer: Medicare PPO

## 2023-06-07 ENCOUNTER — Other Ambulatory Visit: Payer: Self-pay | Admitting: Oncology

## 2023-06-07 ENCOUNTER — Inpatient Hospital Stay: Payer: Medicare PPO | Attending: Hematology and Oncology

## 2023-06-07 DIAGNOSIS — Z79899 Other long term (current) drug therapy: Secondary | ICD-10-CM | POA: Diagnosis not present

## 2023-06-07 LAB — IRON AND TIBC
Iron: 158 ug/dL (ref 45–182)
Saturation Ratios: 54 % — ABNORMAL HIGH (ref 17.9–39.5)
TIBC: 291 ug/dL (ref 250–450)
UIBC: 133 ug/dL

## 2023-06-07 LAB — CBC WITH DIFFERENTIAL (CANCER CENTER ONLY)
Abs Immature Granulocytes: 0 10*3/uL (ref 0.00–0.07)
Band Neutrophils: 0 %
Basophils Absolute: 0.1 10*3/uL (ref 0.0–0.1)
Basophils Relative: 1 %
Blasts: 0 %
Eosinophils Absolute: 0.3 10*3/uL (ref 0.0–0.5)
Eosinophils Relative: 5 %
HCT: 44.7 % (ref 39.0–52.0)
Hemoglobin: 16.3 g/dL (ref 13.0–17.0)
Immature Granulocytes: 0 %
Lymphocytes Relative: 29 %
Lymphs Abs: 1.5 10*3/uL (ref 0.7–4.0)
MCH: 34.8 pg — ABNORMAL HIGH (ref 26.0–34.0)
MCHC: 36.5 g/dL — ABNORMAL HIGH (ref 30.0–36.0)
MCV: 95.3 fL (ref 80.0–100.0)
Metamyelocytes Relative: 0 %
Monocytes Absolute: 0.5 10*3/uL (ref 0.1–1.0)
Monocytes Relative: 9 %
Myelocytes: 0 %
Neutro Abs: 2.7 10*3/uL (ref 1.7–7.7)
Neutrophils Relative %: 56 %
Other: 0 %
Platelet Count: 207 10*3/uL (ref 150–400)
Promyelocytes Relative: 0 %
RBC: 4.69 MIL/uL (ref 4.22–5.81)
RDW: 12 % (ref 11.5–15.5)
WBC Count: 5.1 10*3/uL (ref 4.0–10.5)
nRBC: 0 % (ref 0.0–0.2)
nRBC: 0 /100{WBCs}

## 2023-06-07 LAB — CMP (CANCER CENTER ONLY)
ALT: 37 U/L (ref 0–44)
AST: 42 U/L — ABNORMAL HIGH (ref 15–41)
Albumin: 4.2 g/dL (ref 3.5–5.0)
Alkaline Phosphatase: 114 U/L (ref 38–126)
Anion gap: 11 (ref 5–15)
BUN: 16 mg/dL (ref 8–23)
CO2: 23 mmol/L (ref 22–32)
Calcium: 9.6 mg/dL (ref 8.9–10.3)
Chloride: 103 mmol/L (ref 98–111)
Creatinine: 1.2 mg/dL (ref 0.61–1.24)
GFR, Estimated: >60 mL/min (ref >60)
Glucose, Bld: 205 mg/dL — ABNORMAL HIGH (ref 70–99)
Potassium: 4.8 mmol/L (ref 3.5–5.1)
Sodium: 137 mmol/L (ref 135–145)
Total Bilirubin: 0.6 mg/dL (ref <1.2)
Total Protein: 6.8 g/dL (ref 6.5–8.1)

## 2023-06-07 LAB — FERRITIN: Ferritin: 40 ng/mL (ref 24–336)

## 2023-06-07 NOTE — Progress Notes (Unsigned)
[facility]  [address] [place],  [place]  [address] [phone]  Clinic Day:  10/07/2022  Referring physician: Everlean Cherry, MD  HISTORY OF PRESENT ILLNESS:  The patient is a 72 y.o. 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  He receives phlebotomies to keep his iron parameters back under some semblance of control.  He comes in today for routine follow-up.  Since his last visit, the patient has been doing well.  He denies having any myalgias or other systemic symptoms which concern him for complications related to his underlying hemochromatosis.    PHYSICAL EXAM:  Blood pressure (!) 144/88, pulse (!) 59, temperature 97.8 F (36.6 C), resp. rate 16, height 6' (1.829 m), weight 207 lb 11.2 oz (94.2 kg), SpO2 97%. Wt Readings from Last 3 Encounters:  06/08/23 207 lb 11.2 oz (94.2 kg)  04/29/23 201 lb 3.2 oz (91.3 kg)  03/23/23 199 lb (90.3 kg)   Body mass index is 28.17 kg/m. Performance status (ECOG): 0 - Asymptomatic Physical Exam Constitutional:      Appearance: Normal appearance. He is not ill-appearing.  HENT:     Mouth/Throat:     Mouth: Mucous membranes are moist.     Pharynx: Oropharynx is clear. No oropharyngeal exudate or posterior oropharyngeal erythema.  Cardiovascular:     Rate and Rhythm: Regular rhythm.     Heart sounds: No murmur heard.    No friction rub. No gallop.  Pulmonary:     Effort: Pulmonary effort is normal. No respiratory distress.     Breath sounds: Normal breath sounds. No wheezing, rhonchi or rales.  Abdominal:     General: Bowel sounds are normal. There is no distension.     Palpations: Abdomen is soft. There is no mass.     Tenderness: There is no abdominal tenderness.  Musculoskeletal:        General: No swelling.     Right lower leg: No edema.     Left lower leg: No edema.  Lymphadenopathy:     Cervical: No cervical adenopathy.     Upper Body:     Right upper body: No supraclavicular or axillary  adenopathy.     Left upper body: No supraclavicular or axillary adenopathy.     Lower Body: No right inguinal adenopathy. No left inguinal adenopathy.  Skin:    General: Skin is warm.     Coloration: Skin is not jaundiced.     Findings: No lesion or rash.  Neurological:     General: No focal deficit present.     Mental Status: He is alert and oriented to person, place, and time. Mental status is at baseline.  Psychiatric:        Mood and Affect: Mood normal.        Behavior: Behavior normal.        Thought Content: Thought content normal.    LABS:      Latest Ref Rng & Units 06/07/2023    2:01 PM 02/04/2023    9:16 AM 09/30/2022   10:26 AM  CBC  WBC 4.0 - 10.5 K/uL 5.1  4.6  4.7   Hemoglobin 13.0 - 17.0 g/dL 91.4  78.2  95.6   Hematocrit 39.0 - 52.0 % 44.7  41.7  43.2   Platelets 150 - 400 K/uL 207  218  199       Latest Ref Rng & Units 06/07/2023    2:01 PM 04/29/2023   11:59 AM 02/04/2023  9:16 AM  CMP  Glucose 70 - 99 mg/dL 540  981  191   BUN 8 - 23 mg/dL 16  16  14    Creatinine 0.61 - 1.24 mg/dL 4.78  2.95  6.21   Sodium 135 - 145 mmol/L 137  141  137   Potassium 3.5 - 5.1 mmol/L 4.8  4.5  4.5   Chloride 98 - 111 mmol/L 103  102  105   CO2 22 - 32 mmol/L 23  21  22    Calcium 8.9 - 10.3 mg/dL 9.6  9.5  8.8   Total Protein 6.5 - 8.1 g/dL 6.8   6.8   Total Bilirubin <1.2 mg/dL 0.6   1.7   Alkaline Phos 38 - 126 U/L 114   80   AST 15 - 41 U/L 42   41   ALT 0 - 44 U/L 37   35     Latest Reference Range & Units 09/30/22 10:26  Iron 45 - 182 ug/dL 308  UIBC ug/dL 657  TIBC 846 - 962 ug/dL 952  Saturation Ratios 17.9 - 39.5 % 58 (H)  Ferritin 24 - 336 ng/mL 29  (H): Data is abnormally high  ASSESSMENT & PLAN:  Assessment/Plan:  A 72 y.o. 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  When evaluating his most recent iron parameters, I am pleased as his ferritin is below 50.  As his serum iron and iron saturation are somewhat elevated, he will be phlebotomized monthly for  the next 3/4 months.  Otherwise, I will see him back in 4 months to reassess his iron parameters as it pertains to his underlying hemochromatosis.  The patient understands all the plans discussed today and is in agreement with them.    Hamp Moreland Kirby Funk, MD

## 2023-06-08 ENCOUNTER — Inpatient Hospital Stay: Payer: Medicare PPO | Admitting: Oncology

## 2023-06-08 ENCOUNTER — Other Ambulatory Visit: Payer: Medicare PPO

## 2023-06-08 ENCOUNTER — Other Ambulatory Visit: Payer: Self-pay | Admitting: Oncology

## 2023-06-08 ENCOUNTER — Encounter: Payer: Self-pay | Admitting: Internal Medicine

## 2023-06-09 ENCOUNTER — Inpatient Hospital Stay: Payer: Medicare PPO

## 2023-06-09 NOTE — Progress Notes (Signed)
Felipe Drone presents today for phlebotomy per MD orders. Phlebotomy procedure started at 1410 and ended at 1425. 500 grams removed. Patient observed for 5 minutes after procedure without any incident. Patient tolerated procedure well. IV needle removed intact.  RIGHT AC

## 2023-06-09 NOTE — Patient Instructions (Signed)

## 2023-06-28 ENCOUNTER — Telehealth: Payer: Self-pay

## 2023-06-28 ENCOUNTER — Other Ambulatory Visit: Payer: Self-pay

## 2023-06-28 DIAGNOSIS — R079 Chest pain, unspecified: Secondary | ICD-10-CM

## 2023-06-28 NOTE — Telephone Encounter (Signed)
Spoke with Dr. Bing Matter regarding the patient's CT being denied by his insurance and he recommended for the patient to have a Lexiscan. I relayed this information to the patient and he was agreeable with this plan. Anorder was placed for a Lexiscan via Epic and the following instructions were reviewed with the patient. Patient verbalized understanding and had no further questions at this time.   [facility] [address] [place], [place] [address] Phone:  [phone]    Please arrive 15 minutes prior to your appointment time for registration and insurance purposes.  The test will take approximately 3 to 4 hours to complete; you may bring reading material.  If someone comes with you to your appointment, they will need to remain in the main lobby due to limited space in the testing area. **If you are pregnant or breastfeeding, please notify the nuclear lab prior to your appointment**  How to prepare for your Myocardial Perfusion Test: Do not eat or drink 3 hours prior to your test, except you may have water. Do not consume products containing caffeine (regular or decaffeinated) 12 hours prior to your test. (ex: coffee, chocolate, sodas, tea). Do bring a list of your current medications with you.  If not listed below, you may take your medications as normal. Do wear comfortable clothes (no dresses or overalls) and walking shoes, tennis shoes preferred (No heels or open toe shoes are allowed). Do NOT wear cologne, perfume, aftershave, or lotions (deodorant is allowed). If these instructions are not followed, your test will have to be rescheduled.  Please report to [address] for your test.  If you have questions or concerns about your appointment, you can call the [facility] at [phone].  If you cannot keep your appointment, please provide 24 hours notification to the Nuclear Lab, to avoid a possible $50 charge to your  account.

## 2023-06-28 NOTE — Telephone Encounter (Signed)
-----   Message from [place] sent at 05/27/2023  5:48 PM EST ----- Regarding: RE: Ollen Bowl This does not make a lot of sense typically if you have had a normal myocardial perfusion study and ongoing symptoms that is the indication for cardiac CTA.  Looks like he has insurance wants him to have a stress Myoview performed with schedule in our office. ----- Message ----- From: Francine Graven Sent: 05/27/2023   2:27 PM EST To: Baldo Daub, MD; Lorrin Jackson; # Subject: Ollen Bowl                                    Hello,   [employer] has denied pt's auth. When submitting the Rolene Arbour uploaded, last 2 OV, list of meds, labs, and EKG. That was not enough to approve. Following are the details.   Your request was Denied We've denied the medical services/items listed below requested by you or your doctor or provider:   [phone] - 1 Units - Denied - CT scan of blood vessels and grafts of heart with contrast Why did we deny your request? We denied the medical services/items listed above because: Per Medical Director review of the applicable medical guidelines, we have determined that your request does not meet medical requirements and, therefore, cannot be approved. Medicare will pay for services that it considers to be reasonable and necessary. Current medical guidelines indicate that this service is considered reasonable and necessary when you meet the following requirement(s): I. Cardiac Computed Tomography & Angiography (CCTA, imaging of the heart) is considered appropriate if the following are met: A. The patient has symptomatic coronary artery disease (CAD, damage or disease in the heart's major blood vessels) and requires a CCTA for the following:  1. Evaluation of acute chest pain, unexplained dyspnea (difficulty breathing) or symptoms suggesting angina pectoris (chest pain) when the patient has the following: a. Intermediate pre-test probability (likelihood) of CAD  and ALL of the following: i. No electrocardiogram (EKG, sensors placed on several areas of the chest and a recording is taken to learn about heart function and any problems that may be present) changes to suggest acute myocardial injury (a sudden injury to the heart muscle, often caused by a lack of blood supply to the heart) or ischemia (when tissue affected by a lack of blood flow can recover with the restoration of blood flow) ii. Normal initial cardiac markers (particles in the blood that increase when the heart muscle is damaged) 2. Evaluation of Chest Pain Syndrome (chest pain, which can be caused by various factors, including heart problems, muscle strains, or digestive issues) when the patient has intermediate pre-test probability (likelihood) of CAD and ANY of the following: a. The patient is unable to exercise We can't approve since we did not get notes saying you can't exercise. Also we did not get notes that your heart's electrical test was not able to be explained.  This decision is based on the following Medicare guideline(s): 1. Local Coverage Determination (LCD): Cardiac Computed Tomography & Angiography (CCTA) (P32951) Revision 15 2. Title XVIII of the [employer] Act, Section 1862(a)(1)(A) 3. 42 CFR 411.15 - Particular services excluded from coverage  Thanks, Anisah

## 2023-07-01 ENCOUNTER — Telehealth (HOSPITAL_COMMUNITY): Payer: Self-pay | Admitting: *Deleted

## 2023-07-01 NOTE — Telephone Encounter (Signed)
Patient given detailed instructions per Myocardial Perfusion Study Information Sheet for the test on 07/06/2023 at 8:00. Patient notified to arrive 15 minutes early and that it is imperative to arrive on time for appointment to keep from having the test rescheduled.  If you need to cancel or reschedule your appointment, please call the office within 24 hours of your appointment. . Patient verbalized understanding.[name]

## 2023-07-06 ENCOUNTER — Ambulatory Visit: Payer: Medicare PPO | Attending: Cardiology

## 2023-07-06 DIAGNOSIS — R079 Chest pain, unspecified: Secondary | ICD-10-CM | POA: Diagnosis not present

## 2023-07-06 LAB — MYOCARDIAL PERFUSION IMAGING
LV dias vol: 88 mL (ref 62–150)
LV sys vol: 37 mL
Nuc Stress EF: 57 %
Peak HR: 67 {beats}/min
Rest HR: 49 {beats}/min
Rest Nuclear Isotope Dose: 10.1 mCi
SDS: 4
SRS: 3
SSS: 7
Stress Nuclear Isotope Dose: 31.9 mCi
TID: 1.05

## 2023-07-06 MED ORDER — TECHNETIUM TC 99M TETROFOSMIN IV KIT
31.9000 | PACK | Freq: Once | INTRAVENOUS | Status: AC | PRN
  Administered 2023-07-06: 31.9 via INTRAVENOUS

## 2023-07-06 MED ORDER — TECHNETIUM TC 99M TETROFOSMIN IV KIT
10.1000 | PACK | Freq: Once | INTRAVENOUS | Status: AC | PRN
  Administered 2023-07-06: 10.1 via INTRAVENOUS

## 2023-07-06 MED ORDER — REGADENOSON 0.4 MG/5ML IV SOLN
0.4000 mg | Freq: Once | INTRAVENOUS | Status: AC
  Administered 2023-07-06: 0.4 mg via INTRAVENOUS

## 2023-07-09 ENCOUNTER — Inpatient Hospital Stay: Payer: Medicare PPO | Attending: Oncology

## 2023-07-09 NOTE — Progress Notes (Signed)
 Terry Bolton presents today for phlebotomy per MD orders. Phlebotomy procedure started at 1408 and ended at 1422. 500 grams removed. Patient observed for 5 minutes after procedure without any incident. Patient tolerated procedure well. IV needle removed intact. RIGHT AC Failed attempt LEFT AC, bruising at site.  Stable at discharge

## 2023-07-20 ENCOUNTER — Telehealth: Payer: Self-pay

## 2023-07-20 NOTE — Telephone Encounter (Signed)
 Results reviewed with pt as per Dr. Vanetta Shawl note.  Pt verbalized understanding and had no additional questions. Routed to PCP

## 2023-07-27 DIAGNOSIS — G8929 Other chronic pain: Secondary | ICD-10-CM | POA: Insufficient documentation

## 2023-07-29 NOTE — Progress Notes (Signed)
Cardiology Office Note:    Date:  07/30/2023   ID:  Terry Bolton, DOB 05-09-1951, MRN [id number]  PCP:  Terry Cherry, MD  Cardiologist:  Terry Herrlich, MD    Referring MD: Terry Cherry, MD    ASSESSMENT:    1. Chest pain of uncertain etiology   2. APC (atrial premature contractions)   3. Hemochromatosis, hereditary (HCC)   4. 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  Concerns regarding medicines are outlined above.  No orders of the defined types were placed in this encounter.  No orders of the defined types were placed in this encounter.    History of Present Illness:    Terry Bolton is a 73 y.o. male with a hx of frequent APCs noted on a screening EKG hemochromatosis hypertension and chest pain.  Last seen 04/29/2023.  Following that visit and echocardiogram 06/01/2019 left ventricle normal size wall thickness systolic function EF 60 to 65% with grade 1 diastolic filling nuclear size and function and there is moderate left atrial enlargement  And event monitor reported 05/16/2023 showing isolated supraventricular ectopy and 31 episodes of SVT the longest 12 and half seconds.  All of these were atrial tachycardia.  Ventricular ectopy was  rare and there is no bradycardia.  He had a myocardial perfusion study performed 07/06/2023 normal left ventricular function ejection fraction 57% perfusion described as normal.  Study was low risk.  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   Hypertension, essential 08/08/2015   OA (osteoarthritis) of hip 10/25/2019   OSA (obstructive sleep apnea) 09/10/2015   Pain of left hip joint 09/07/2019   Pigment cirrhosis (HCC)    PONV (postoperative nausea and vomiting)    Posterior tibial tendonitis, left 12/16/2017   Statin declined 09/17/2017   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   Tightness of heel cord, left 12/16/2017   Type 2 diabetes mellitus with diabetic polyneuropathy, 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/2023    Current Medications: Current Meds  Medication Sig   albuterol (VENTOLIN HFA) 108 (90 Base) MCG/ACT inhaler Inhale 1-2 puffs into the lungs as needed for wheezing or shortness of breath.   lisinopril (ZESTRIL) 10 MG tablet Take 1 tablet by mouth daily.   MAGNESIUM CITRATE PO Take 250 mg by mouth daily.   metFORMIN (GLUCOPHAGE-XR) 500 MG 24 hr tablet  Take 500 mg by mouth at bedtime.   milk thistle 175 MG tablet Take 175 mg by mouth daily.   Multiple Vitamin (MULTIVITAMIN) capsule Take 1 capsule by mouth daily.   naproxen (NAPROSYN) 250 MG tablet Take 250 mg by mouth as needed for mild pain (pain score 1-3) or moderate pain (pain score 4-6).   PREDNISONE PO Take 1 tablet by mouth in the morning, at noon, and at bedtime. 5 day treatment   [DISCONTINUED]  metoprolol tartrate (LOPRESSOR) 100 MG tablet Take 1 tablet (100 mg total) by mouth once for 1 dose. Please take this medication 2 hours before CT.      EKGs/Labs/Other Studies Reviewed:    The following studies were reviewed today:  Cardiac Studies & Procedures     STRESS TESTS  MYOCARDIAL PERFUSION IMAGING 07/06/2023  Narrative   The study is normal. The study is low risk.   Left ventricular function is normal. Nuclear stress EF: 57%. The left ventricular ejection fraction is normal (55-65%). End diastolic cavity size is normal.   Prior study available for comparison from 07/24/2015.  ECHOCARDIOGRAM  ECHOCARDIOGRAM COMPLETE 06/01/2023  Narrative ECHOCARDIOGRAM REPORT    Patient Name:   Terry Bolton Date of Exam: 06/01/2023 Medical Rec #:  [id number]        Height:       72.0 in Accession #:    [id number]       Weight:       201.2 lb Date of Birth:  01-09-1951        BSA:          2.136 m Patient Age:    72 years         BP:           176/89 mmHg Patient Gender: M                HR:           57 bpm. Exam Location:  [place]  Procedure: 2D Echo, Cardiac Doppler, Color Doppler and Strain Analysis  Indications:    Abnormal EKG [R94.31 (ICD-10-CM)]; Abnormal electrocardiogram [R94.31 (ICD-10-CM)]  History:        Patient has no prior history of Echocardiogram examinations. Abnormal ECG; Risk Factors:Diabetes.  Sonographer:    Margreta Journey RDCS Referring Phys: [id number] Maryssa Giampietro J Donnesha Karg  IMPRESSIONS   1. Left ventricular ejection fraction, by estimation, is 60 to 65%. The left ventricle has normal function. The left ventricle has no regional wall motion abnormalities. Left ventricular diastolic parameters are consistent with Grade I diastolic dysfunction (impaired relaxation). 2. Right ventricular systolic function is normal. The right ventricular size is normal. There is normal pulmonary artery systolic pressure. 3. Left atrial size was moderately dilated. 4. The  mitral valve is normal in structure. Trivial mitral valve regurgitation. No evidence of mitral stenosis. 5. The aortic valve is normal in structure. Aortic valve regurgitation is not visualized. No aortic stenosis is present. 6. The inferior vena cava is normal in size with greater than 50% respiratory variability, suggesting right atrial pressure of 3 mmHg.  FINDINGS Left Ventricle: Left ventricular ejection fraction, by estimation, is 60 to 65%. The left ventricle has normal function. The left ventricle has no regional wall motion abnormalities. The left ventricular internal cavity size was normal in size. There is no left ventricular hypertrophy. Left ventricular diastolic parameters are consistent with Grade I diastolic dysfunction (impaired relaxation).  Right Ventricle: The right ventricular size is normal. No increase in right  ventricular wall thickness. Right ventricular systolic function is normal. There is normal pulmonary artery systolic pressure. The tricuspid regurgitant velocity is 2.61 m/s, and with an assumed right atrial pressure of 3 mmHg, the estimated right ventricular systolic pressure is 30.2 mmHg.  Left Atrium: Left atrial size was moderately dilated.  Right Atrium: Right atrial size was normal in size.  Pericardium: There is no evidence of pericardial effusion.  Mitral Valve: The mitral valve is normal in structure. Trivial mitral valve regurgitation. No evidence of mitral valve stenosis.  Tricuspid Valve: The tricuspid valve is normal in structure. Tricuspid valve regurgitation is not demonstrated. No evidence of tricuspid stenosis.  Aortic Valve: The aortic valve is normal in structure. Aortic valve regurgitation is not visualized. No aortic stenosis is present.  Pulmonic Valve: The pulmonic valve was normal in structure. Pulmonic valve regurgitation is not visualized. No evidence of pulmonic stenosis.  Aorta: The aortic root is normal in size and  structure.  Venous: The inferior vena cava is normal in size with greater than 50% respiratory variability, suggesting right atrial pressure of 3 mmHg.  IAS/Shunts: No atrial level shunt detected by color flow Doppler.   LEFT VENTRICLE PLAX 2D LVIDd:         4.60 cm   Diastology LVIDs:         3.40 cm   LV e' medial:    6.74 cm/s LV PW:         0.90 cm   LV E/e' medial:  11.8 LV IVS:        1.20 cm   LV e' lateral:   9.70 cm/s LVOT diam:     2.20 cm   LV E/e' lateral: 8.2 LV SV:         85 LV SV Index:   40 LVOT Area:     3.80 cm   RIGHT VENTRICLE             IVC RV Basal diam:  3.20 cm     IVC diam: 1.80 cm RV Mid diam:    3.20 cm RV S prime:     13.20 cm/s TAPSE (M-mode): 2.7 cm  LEFT ATRIUM             Index        RIGHT ATRIUM           Index LA diam:        4.10 cm 1.92 cm/m   RA Area:     16.50 cm LA Vol (A2C):   55.3 ml 25.89 ml/m  RA Volume:   36.60 ml  17.13 ml/m LA Vol (A4C):   97.0 ml 45.41 ml/m LA Biplane Vol: 73.0 ml 34.18 ml/m AORTIC VALVE LVOT Vmax:   108.33 cm/s LVOT Vmean:  66.533 cm/s LVOT VTI:    0.222 m  AORTA Ao Root diam: 3.60 cm Ao Asc diam:  3.70 cm Ao Desc diam: 2.20 cm  MITRAL VALVE               TRICUSPID VALVE MV Area (PHT): 2.75 cm    TR Peak grad:   27.2 mmHg MV Decel Time: 276 msec    TR Vmax:        261.00 cm/s MR Peak grad: 52.1 mmHg MR Vmax:      361.00 cm/s  SHUNTS MV E velocity: 79.53 cm/s  Systemic VTI:  0.22 m MV A velocity: 95.93 cm/s  Systemic Diam: 2.20 cm MV E/A ratio:  0.83  Rajan Revankar  MD Electronically signed by Belva Crome MD Signature Date/Time: 06/01/2023/3:59:35 PM    Final   MONITORS  LONG TERM MONITOR (3-14 DAYS) 05/14/2023  Narrative Patch Wear Time:  8 days and 7 hours (2024-10-24T11:50:53-0400 to 2024-11-01T19:18:37-0400)  Patient had a min HR of 45 bpm, max HR of 179 bpm, and avg HR of 67 bpm. Predominant underlying rhythm was Sinus Rhythm.  There were 8 triggered events all sinus  rhythm 2 associated with occasional atrial premature contractions.  There were no sinus pauses of 3 seconds or greater and no episodes of second or third-degree AV nodal block.  There were no episodes of atrial fibrillation or flutter  31 Supraventricular Tachycardia runs occurred, the run with the fastest interval lasting 4 beats with a max rate of 179 bpm, the longest lasting 12.5 secs with an avg rate of 105 bpm.  The longest was an episode of atrial tachycardia.  Isolated SVEs were occasional (2.9%, 19291), SVE Couplets were rare (<1.0%, 199), and SVE Triplets were rare (<1.0%, 45).  Isolated VEs were rare (<1.0%), VE Couplets were rare (<1.0%), and no VE Triplets were present. Ventricular Bigeminy and Trigeminy were present.               Recent Labs: 06/07/2023: ALT 37; BUN 16; Creatinine 1.20; Hemoglobin 16.3; Platelet Count 207; Potassium 4.8; Sodium 137  Recent Lipid Panel No results found for: "CHOL", "TRIG", "HDL", "CHOLHDL", "VLDL", "LDLCALC", "LDLDIRECT"  Physical Exam:    VS:  BP (!) 156/90 (BP Location: Right Arm, Patient Position: Sitting)   Pulse (!) 53   Ht 6' (1.829 m)   Wt 207 lb 12.8 oz (94.3 kg)   SpO2 98%   BMI 28.18 kg/m     Wt Readings from Last 3 Encounters:  07/30/23 207 lb 12.8 oz (94.3 kg)  07/06/23 207 lb (93.9 kg)  06/08/23 207 lb 11.2 oz (94.2 kg)     GEN:  Well nourished, well developed in no acute distress HEENT: Normal NECK: No JVD; No carotid bruits LYMPHATICS: No lymphadenopathy CARDIAC: RRR, no murmurs, rubs, gallops RESPIRATORY:  Clear to auscultation without rales, wheezing or rhonchi  ABDOMEN: Soft, non-tender, non-distended MUSCULOSKELETAL:  No edema; No deformity  SKIN: Warm and dry NEUROLOGIC:  Alert and oriented x 3 PSYCHIATRIC:  Normal affect    Signed, Terry Herrlich, MD  07/30/2023 10:29 AM    [facility]

## 2023-07-30 ENCOUNTER — Ambulatory Visit: Payer: Medicare PPO | Attending: Cardiology | Admitting: Cardiology

## 2023-07-30 ENCOUNTER — Encounter: Payer: Self-pay | Admitting: Cardiology

## 2023-07-30 VITALS — BP 156/90 | HR 53 | Ht 72.0 in | Wt 207.8 lb

## 2023-07-30 DIAGNOSIS — I491 Atrial premature depolarization: Secondary | ICD-10-CM

## 2023-07-30 DIAGNOSIS — I10 Essential (primary) hypertension: Secondary | ICD-10-CM

## 2023-07-30 DIAGNOSIS — R079 Chest pain, unspecified: Secondary | ICD-10-CM

## 2023-07-30 NOTE — Patient Instructions (Signed)
Medication Instructions:  Your physician recommends that you continue on your current medications as directed. Please refer to the Current Medication list given to you today.  *If you need a refill on your cardiac medications before your next appointment, please call your pharmacy*   Lab Work: None If you have labs (blood work) drawn today and your tests are completely normal, you will receive your results only by: MyChart Message (if you have MyChart) OR A paper copy in the mail If you have any lab test that is abnormal or we need to change your treatment, we will call you to review the results.   Testing/Procedures: We will order CT coronary calcium score. It will cost $99.00 and iis due at time of scan.  Please call to schedule.     [facility] [address] [place], [place] [address] [phone]  Or  [facility] [address] [place], [place] [address] [phone] option 7    Follow-Up: At [facility], you and your health needs are our priority.  As part of our continuing mission to provide you with exceptional heart care, we have created designated Provider Care Teams.  These Care Teams include your primary Cardiologist (physician) and Advanced Practice Providers (APPs -  Physician Assistants and Nurse Practitioners) who all work together to provide you with the care you need, when you need it.  We recommend signing up for the patient portal called "MyChart".  Sign up information is provided on this After Visit Summary.  MyChart is used to connect with patients for Virtual Visits (Telemedicine).  Patients are able to view lab/test results, encounter notes, upcoming appointments, etc.  Non-urgent messages can be sent to your provider as well.   To learn more about what you can do with MyChart, go to [email or web address].    Your next appointment:   Follow up as needed  Provider:   Norman Herrlich, MD    Other Instructions None

## 2023-08-09 ENCOUNTER — Ambulatory Visit (HOSPITAL_BASED_OUTPATIENT_CLINIC_OR_DEPARTMENT_OTHER)
Admission: RE | Admit: 2023-08-09 | Discharge: 2023-08-09 | Disposition: A | Discharge status: Home / Self Care | Payer: Self-pay | Source: Ambulatory Visit | Attending: Cardiology | Admitting: Cardiology

## 2023-08-09 ENCOUNTER — Inpatient Hospital Stay: Payer: Medicare PPO | Attending: Oncology

## 2023-08-09 DIAGNOSIS — Z79899 Other long term (current) drug therapy: Secondary | ICD-10-CM | POA: Diagnosis not present

## 2023-08-09 DIAGNOSIS — R079 Chest pain, unspecified: Secondary | ICD-10-CM

## 2023-08-09 DIAGNOSIS — I10 Essential (primary) hypertension: Secondary | ICD-10-CM

## 2023-08-09 DIAGNOSIS — I491 Atrial premature depolarization: Secondary | ICD-10-CM

## 2023-08-09 NOTE — Patient Instructions (Signed)

## 2023-08-09 NOTE — Progress Notes (Signed)
Felipe Drone presents today for phlebotomy per MD orders. Phlebotomy procedure started at 1357 and ended at 1412. 550 grams removed. Patient declined to be observed for 30 minutes after procedure, d/c without any incident. Patient tolerated procedure well. Phlebotomy kit IV needle removed intact.

## 2023-08-23 ENCOUNTER — Other Ambulatory Visit: Payer: Self-pay

## 2023-08-24 ENCOUNTER — Telehealth: Payer: Self-pay

## 2023-08-24 ENCOUNTER — Other Ambulatory Visit: Payer: Self-pay

## 2023-08-24 DIAGNOSIS — R072 Precordial pain: Secondary | ICD-10-CM

## 2023-08-24 MED ORDER — ROSUVASTATIN CALCIUM 10 MG PO TABS: 10.0000 mg | ORAL_TABLET | Freq: Every day | ORAL | 3 refills | Status: AC

## 2023-08-24 NOTE — Telephone Encounter (Signed)
   Your cardiac CT will be scheduled at one of the below locations:   [facility] [address] [place], [place] [address] [phone]  OR  [facility] [address] [place], [place] [address] [phone]  OR   [facility] [address] [place], [place] [address] [phone]  OR   [facility] [address] [place], [place] [address] [phone]  If scheduled at [facility], please arrive at the [facility] and Children's Entrance (Entrance C2) of [facility] 30 minutes prior to test start time. You can use the FREE valet parking offered at entrance C (encouraged to control the heart rate for the test)  Proceed to the [facility] Radiology Department (first floor) to check-in and test prep.  All radiology patients and guests should use entrance C2 at [facility], accessed from [facility], even though the hospital's physical address listed is [address]    If scheduled at [facility] or [facility], please arrive 15 mins early for check-in and test prep.  There is spacious parking and easy access to the radiology department from the [facility] entrance. Please enter here and check-in with the desk attendant.   If scheduled at [facility], please arrive 30 minutes early for check-in and test prep.  Please follow these instructions carefully (unless otherwise directed):  An IV will be required for this test and Nitroglycerin will be given.  Hold all erectile dysfunction medications at least 3 days (72 hrs) prior to test. (Ie viagra, cialis, sildenafil, tadalafil, etc)   On the Night Before the Test: Be sure to Drink plenty of water. Do not consume any caffeinated/decaffeinated beverages or chocolate 12 hours prior to your test. Do not take any  antihistamines 12 hours prior to your test.  On the Day of the Test: Drink plenty of water until 1 hour prior to the test. Do not eat any food 1 hour prior to test. You may take your regular medications prior to the test.  Take metoprolol (Lopressor) two hours prior to test. Patients who wear a continuous glucose monitor MUST remove the device prior to scanning.     After the Test: Drink plenty of water. After receiving IV contrast, you may experience a mild flushed feeling. This is normal. On occasion, you may experience a mild rash up to 24 hours after the test. This is not dangerous. If this occurs, you can take Benadryl 25 mg, Zyrtec, Claritin, or Allegra and increase your fluid intake. (Patients taking Tikosyn should avoid Benadryl, and may take Zyrtec, Claritin, or Allegra) If you experience trouble breathing, this can be serious. If it is severe call 911 IMMEDIATELY. If it is mild, please call our office.  We will call to schedule your test 2-4 weeks out understanding that some insurance companies will need an authorization prior to the service being performed.   For more information and frequently asked questions, please visit our website : [email or web address]  For non-scheduling related questions, please contact the cardiac imaging nurse navigator should you have any questions/concerns: Cardiac Imaging Nurse Navigators Direct Office Dial: [phone]   For scheduling needs, including cancellations and rescheduling, please call [place], [phone].

## 2023-08-24 NOTE — Progress Notes (Signed)
Patient informed of results. Rosuvastatin medication ordered via Epic and sent to the patients pharmacy. Cardiac CTA ordered via Epic and instructions reviewed with the patient. Patient verbalized understanding and had no further questions at this time.

## 2023-09-01 ENCOUNTER — Telehealth: Payer: Self-pay

## 2023-09-01 ENCOUNTER — Other Ambulatory Visit: Payer: Self-pay

## 2023-09-01 DIAGNOSIS — R072 Precordial pain: Secondary | ICD-10-CM

## 2023-09-01 MED ORDER — METOPROLOL TARTRATE 25 MG PO TABS: 25.0000 mg | ORAL_TABLET | Freq: Once | ORAL | 0 refills | Status: AC

## 2023-09-01 NOTE — Telephone Encounter (Signed)
 Marland Kitchen

## 2023-09-03 ENCOUNTER — Other Ambulatory Visit: Payer: Self-pay

## 2023-09-03 ENCOUNTER — Telehealth: Payer: Self-pay

## 2023-09-03 LAB — BASIC METABOLIC PANEL
BUN/Creatinine Ratio: 15 (ref 10–24)
BUN: 15 mg/dL (ref 8–27)
CO2: 21 mmol/L (ref 20–29)
Calcium: 9.1 mg/dL (ref 8.6–10.2)
Chloride: 103 mmol/L (ref 96–106)
Creatinine, Ser: 1 mg/dL (ref 0.76–1.27)
Glucose: 200 mg/dL — ABNORMAL HIGH (ref 70–99)
Potassium: 4.2 mmol/L (ref 3.5–5.2)
Sodium: 141 mmol/L (ref 134–144)
eGFR: 80 mL/min/{1.73_m2} (ref >59)

## 2023-09-03 NOTE — Telephone Encounter (Signed)
 Called the patient and reviewed the instructions for his cardiac CTA with him. Patient verbalized understanding and had no further questions at this time.

## 2023-09-06 ENCOUNTER — Telehealth: Payer: Self-pay

## 2023-09-06 NOTE — Telephone Encounter (Signed)
 Patient was returning call. Please advise ?

## 2023-09-06 NOTE — Telephone Encounter (Signed)
 Received the following message, below from [place]:  " I had made sure his Berkley Harvey was approve this round before calling him. The last time he was denied. I contacted pre cert this morning they gave me the green light. He has been called waiting on a call back. Hopefully soon".  Attempted to call the patient to informed him that his cardiac CTA had been approved and they were reaching out to schedule his appointment. He did not answer the phone and a message was left for him to call back.

## 2023-09-06 NOTE — Telephone Encounter (Signed)
 Called patient and informed him that the cardiac CTA scheduler was trying to contact him to schedule his test. He stated that he would call them back today. Patient had no further questions at this time.

## 2023-09-20 ENCOUNTER — Telehealth (HOSPITAL_COMMUNITY): Payer: Self-pay | Admitting: *Deleted

## 2023-09-20 NOTE — Telephone Encounter (Signed)
Reaching out to patient to offer assistance regarding upcoming cardiac imaging study; pt verbalizes understanding of appt date/time, parking situation and where to check in, pre-test NPO status, and verified current allergies; name and call back number provided for further questions should they arise  Larey Brick RN Navigator Cardiac Imaging [facility] [phone] office [phone] cell  Patient aware to arrive at 12 PM.

## 2023-09-21 ENCOUNTER — Ambulatory Visit (HOSPITAL_COMMUNITY)
Admission: RE | Admit: 2023-09-21 | Discharge: 2023-09-21 | Disposition: A | Discharge status: Home / Self Care | Source: Ambulatory Visit | Attending: Cardiology | Admitting: Cardiology

## 2023-09-21 DIAGNOSIS — R072 Precordial pain: Secondary | ICD-10-CM

## 2023-09-21 MED ORDER — NITROGLYCERIN 0.4 MG SL SUBL
SUBLINGUAL_TABLET | SUBLINGUAL | Status: AC
  Filled 2023-09-21: qty 2

## 2023-09-21 MED ORDER — NITROGLYCERIN 0.4 MG SL SUBL
0.8000 mg | SUBLINGUAL_TABLET | Freq: Once | SUBLINGUAL | Status: AC
  Administered 2023-09-21: 0.8 mg via SUBLINGUAL

## 2023-09-21 MED ORDER — IOHEXOL 350 MG/ML SOLN
100.0000 mL | Freq: Once | INTRAVENOUS | Status: AC | PRN
  Administered 2023-09-21: 100 mL via INTRAVENOUS

## 2023-09-21 NOTE — Progress Notes (Signed)
Pt verbalized understanding of discharge instruction; opportunity for questions provided ?

## 2023-09-23 ENCOUNTER — Telehealth: Payer: Self-pay

## 2023-09-23 NOTE — Telephone Encounter (Signed)
Left message on My Chart with CT results per Dr. Vanetta Shawl note. Routed to PCP.

## 2023-09-23 NOTE — Telephone Encounter (Signed)
 Pt viewed lab results on My Chart per Dr. Hulen Shouts note. Routed to PCP.

## 2023-10-06 ENCOUNTER — Inpatient Hospital Stay: Payer: Medicare PPO | Attending: Oncology

## 2023-10-06 DIAGNOSIS — Z79899 Other long term (current) drug therapy: Secondary | ICD-10-CM | POA: Insufficient documentation

## 2023-10-06 LAB — CBC WITH DIFFERENTIAL (CANCER CENTER ONLY)
Abs Immature Granulocytes: 0.01 10*3/uL (ref 0.00–0.07)
Basophils Absolute: 0.1 10*3/uL (ref 0.0–0.1)
Basophils Relative: 2 %
Eosinophils Absolute: 0.3 10*3/uL (ref 0.0–0.5)
Eosinophils Relative: 8 %
HCT: 42.9 % (ref 39.0–52.0)
Hemoglobin: 16 g/dL (ref 13.0–17.0)
Immature Granulocytes: 0 %
Lymphocytes Relative: 27 %
Lymphs Abs: 1 10*3/uL (ref 0.7–4.0)
MCH: 34.3 pg — ABNORMAL HIGH (ref 26.0–34.0)
MCHC: 37.3 g/dL — ABNORMAL HIGH (ref 30.0–36.0)
MCV: 92.1 fL (ref 80.0–100.0)
Monocytes Absolute: 0.4 10*3/uL (ref 0.1–1.0)
Monocytes Relative: 9 %
Neutro Abs: 2 10*3/uL (ref 1.7–7.7)
Neutrophils Relative %: 54 %
Platelet Count: 192 10*3/uL (ref 150–400)
RBC: 4.66 MIL/uL (ref 4.22–5.81)
RDW: 11.8 % (ref 11.5–15.5)
WBC Count: 3.7 10*3/uL — ABNORMAL LOW (ref 4.0–10.5)
nRBC: 0 % (ref 0.0–0.2)
nRBC: 0 /100{WBCs}

## 2023-10-06 LAB — CMP (CANCER CENTER ONLY)
ALT: 34 U/L (ref 0–44)
AST: 43 U/L — ABNORMAL HIGH (ref 15–41)
Albumin: 4.1 g/dL (ref 3.5–5.0)
Alkaline Phosphatase: 101 U/L (ref 38–126)
Anion gap: 12 (ref 5–15)
BUN: 16 mg/dL (ref 8–23)
CO2: 23 mmol/L (ref 22–32)
Calcium: 9 mg/dL (ref 8.9–10.3)
Chloride: 105 mmol/L (ref 98–111)
Creatinine: 1.09 mg/dL (ref 0.61–1.24)
GFR, Estimated: >60 mL/min (ref >60)
Glucose, Bld: 220 mg/dL — ABNORMAL HIGH (ref 70–99)
Potassium: 4 mmol/L (ref 3.5–5.1)
Sodium: 139 mmol/L (ref 135–145)
Total Bilirubin: 0.8 mg/dL (ref 0.0–1.2)
Total Protein: 6.2 g/dL — ABNORMAL LOW (ref 6.5–8.1)

## 2023-10-06 LAB — IRON AND TIBC
Iron: 154 ug/dL (ref 45–182)
Saturation Ratios: 53 % — ABNORMAL HIGH (ref 17.9–39.5)
TIBC: 288 ug/dL (ref 250–450)
UIBC: 134 ug/dL

## 2023-10-06 LAB — FERRITIN: Ferritin: 38 ng/mL (ref 24–336)

## 2023-10-06 NOTE — Progress Notes (Unsigned)
 [facility]  [address] [place],  [place]  [address] [phone]  Clinic Day:  10/07/2022  Referring physician: Everlean Cherry, MD  HISTORY OF PRESENT ILLNESS:  The patient is a 73 y.o. 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  He receives phlebotomies to keep his iron parameters under some semblance of control.  Ultimately, the goal is to keep his ferritin less than 50 and his other iron parameters relatively stable.  He comes in today for routine follow-up.  Since his last visit, the patient has been doing well.  He denies having any myalgias or other systemic symptoms which concern him for complications related to his underlying hemochromatosis.    PHYSICAL EXAM:  Blood pressure (!) 141/81, pulse 61, temperature 98.2 F (36.8 C), temperature source Oral, resp. rate 16, height 6' (1.829 m), weight 206 lb (93.4 kg), SpO2 95%. Wt Readings from Last 3 Encounters:  10/07/23 206 lb (93.4 kg)  07/30/23 207 lb 12.8 oz (94.3 kg)  07/06/23 207 lb (93.9 kg)   Body mass index is 27.94 kg/m. Performance status (ECOG): 0 - Asymptomatic Physical Exam Constitutional:      Appearance: Normal appearance. He is not ill-appearing.  HENT:     Mouth/Throat:     Mouth: Mucous membranes are moist.     Pharynx: Oropharynx is clear. No oropharyngeal exudate or posterior oropharyngeal erythema.  Cardiovascular:     Rate and Rhythm: Regular rhythm.     Heart sounds: No murmur heard.    No friction rub. No gallop.  Pulmonary:     Effort: Pulmonary effort is normal. No respiratory distress.     Breath sounds: Normal breath sounds. No wheezing, rhonchi or rales.  Abdominal:     General: Bowel sounds are normal. There is no distension.     Palpations: Abdomen is soft. There is no mass.     Tenderness: There is no abdominal tenderness.  Musculoskeletal:        General: No swelling.     Right lower leg: No edema.     Left lower leg: No edema.  Lymphadenopathy:      Cervical: No cervical adenopathy.     Upper Body:     Right upper body: No supraclavicular or axillary adenopathy.     Left upper body: No supraclavicular or axillary adenopathy.     Lower Body: No right inguinal adenopathy. No left inguinal adenopathy.  Skin:    General: Skin is warm.     Coloration: Skin is not jaundiced.     Findings: No lesion or rash.  Neurological:     General: No focal deficit present.     Mental Status: He is alert and oriented to person, place, and time. Mental status is at baseline.  Psychiatric:        Mood and Affect: Mood normal.        Behavior: Behavior normal.        Thought Content: Thought content normal.    LABS:      Latest Ref Rng & Units 10/06/2023    9:14 AM 06/07/2023    2:01 PM 02/04/2023    9:16 AM  CBC  WBC 4.0 - 10.5 K/uL 3.7  5.1  4.6   Hemoglobin 13.0 - 17.0 g/dL 91.4  78.2  95.6   Hematocrit 39.0 - 52.0 % 42.9  44.7  41.7   Platelets 150 - 400 K/uL 192  207  218       Latest Ref Rng &  Units 10/06/2023    9:14 AM 09/02/2023   10:18 AM 06/07/2023    2:01 PM  CMP  Glucose 70 - 99 mg/dL 045  409  811   BUN 8 - 23 mg/dL 16  15  16    Creatinine 0.61 - 1.24 mg/dL 9.14  7.82  9.56   Sodium 135 - 145 mmol/L 139  141  137   Potassium 3.5 - 5.1 mmol/L 4.0  4.2  4.8   Chloride 98 - 111 mmol/L 105  103  103   CO2 22 - 32 mmol/L 23  21  23    Calcium 8.9 - 10.3 mg/dL 9.0  9.1  9.6   Total Protein 6.5 - 8.1 g/dL 6.2   6.8   Total Bilirubin 0.0 - 1.2 mg/dL 0.8   0.6   Alkaline Phos 38 - 126 U/L 101   114   AST 15 - 41 U/L 43   42   ALT 0 - 44 U/L 34   37     Latest Reference Range & Units 10/06/23 09:14  Iron 45 - 182 ug/dL 213  UIBC ug/dL 086  TIBC 578 - 469 ug/dL 629  Saturation Ratios 17.9 - 39.5 % 53 (H)  Ferritin 24 - 336 ng/mL 38  (H): Data is abnormally high  ASSESSMENT & PLAN:  Assessment/Plan:  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  When evaluating his most recent iron parameters, I am pleased as his ferritin is  below 50.  As his serum iron and iron saturation are somewhat elevated, he will be phlebotomized today and in 2 months.  I will see him back in 4 months to reassess his iron parameters as it pertains to his underlying hemochromatosis.  The patient understands all the plans discussed today and is in agreement with them.    Terry Bellanger Kirby Funk, MD

## 2023-10-07 ENCOUNTER — Inpatient Hospital Stay: Payer: Medicare PPO

## 2023-10-07 ENCOUNTER — Other Ambulatory Visit: Payer: Self-pay

## 2023-10-07 ENCOUNTER — Inpatient Hospital Stay: Payer: Medicare PPO | Admitting: Oncology

## 2023-10-07 MED ORDER — HEPARIN SOD (PORK) LOCK FLUSH 100 UNIT/ML IV SOLN
500.0000 [IU] | Freq: Once | INTRAVENOUS | Status: AC
  Administered 2023-10-07: 500 [IU] via INTRAVENOUS

## 2023-10-07 MED ORDER — SODIUM CHLORIDE 0.9% FLUSH
10.0000 mL | INTRAVENOUS | Status: AC | PRN
  Administered 2023-10-07: 10 mL via INTRAVENOUS

## 2023-10-07 NOTE — Addendum Note (Signed)
 Addended by: Garnette Scheuermann on: 10/07/2023 02:33 PM   Modules accepted: Orders

## 2023-10-07 NOTE — Progress Notes (Signed)
 1400 phlebotomy today and in 2 months, follow up in 4 months per dr. Melvyn Neth orders. 1405 Terry Bolton presents today for phlebotomy per MD orders. Phlebotomy procedure started at 1405 and ended at 1415. 400 grams removed. Patient observed for 30 minutes after procedure without any incident. Patient tolerated procedure well. IV needle removed intact.

## 2023-12-02 ENCOUNTER — Encounter

## 2023-12-07 ENCOUNTER — Inpatient Hospital Stay

## 2023-12-10 ENCOUNTER — Inpatient Hospital Stay

## 2023-12-13 ENCOUNTER — Inpatient Hospital Stay: Attending: Oncology

## 2023-12-13 DIAGNOSIS — Z79899 Other long term (current) drug therapy: Secondary | ICD-10-CM | POA: Insufficient documentation

## 2023-12-13 NOTE — Patient Instructions (Signed)

## 2023-12-13 NOTE — Progress Notes (Signed)
 Terry Bolton presents today for phlebotomy per MD orders. Phlebotomy procedure started at 1510 and ended at 1540. 500 cc removed. Port used for phlebotomy. Patient tolerated well

## 2024-02-04 ENCOUNTER — Inpatient Hospital Stay: Attending: Hematology and Oncology

## 2024-02-04 DIAGNOSIS — Z79899 Other long term (current) drug therapy: Secondary | ICD-10-CM | POA: Diagnosis not present

## 2024-02-04 LAB — IRON AND TIBC
Iron: 145 ug/dL (ref 45–182)
Saturation Ratios: 53 % — ABNORMAL HIGH (ref 17.9–39.5)
TIBC: 276 ug/dL (ref 250–450)
UIBC: 131 ug/dL

## 2024-02-04 LAB — CBC WITH DIFFERENTIAL (CANCER CENTER ONLY)
Abs Immature Granulocytes: 0.01 K/uL (ref 0.00–0.07)
Basophils Absolute: 0.1 K/uL (ref 0.0–0.1)
Basophils Relative: 2 %
Eosinophils Absolute: 0.4 K/uL (ref 0.0–0.5)
Eosinophils Relative: 8 %
HCT: 41.4 % (ref 39.0–52.0)
Hemoglobin: 15.2 g/dL (ref 13.0–17.0)
Immature Granulocytes: 0 %
Lymphocytes Relative: 24 %
Lymphs Abs: 1.1 K/uL (ref 0.7–4.0)
MCH: 34.5 pg — ABNORMAL HIGH (ref 26.0–34.0)
MCHC: 36.7 g/dL — ABNORMAL HIGH (ref 30.0–36.0)
MCV: 94.1 fL (ref 80.0–100.0)
Monocytes Absolute: 0.5 K/uL (ref 0.1–1.0)
Monocytes Relative: 10 %
Neutro Abs: 2.5 K/uL (ref 1.7–7.7)
Neutrophils Relative %: 56 %
Platelet Count: 185 K/uL (ref 150–400)
RBC: 4.4 MIL/uL (ref 4.22–5.81)
RDW: 11.9 % (ref 11.5–15.5)
WBC Count: 4.5 K/uL (ref 4.0–10.5)
nRBC: 0 % (ref 0.0–0.2)

## 2024-02-04 LAB — CMP (CANCER CENTER ONLY)
ALT: 34 U/L (ref 0–44)
AST: 33 U/L (ref 15–41)
Albumin: 4 g/dL (ref 3.5–5.0)
Alkaline Phosphatase: 104 U/L (ref 38–126)
Anion gap: 14 (ref 5–15)
BUN: 21 mg/dL (ref 8–23)
CO2: 20 mmol/L — ABNORMAL LOW (ref 22–32)
Calcium: 9 mg/dL (ref 8.9–10.3)
Chloride: 107 mmol/L (ref 98–111)
Creatinine: 1.04 mg/dL (ref 0.61–1.24)
GFR, Estimated: >60 mL/min (ref >60)
Glucose, Bld: 181 mg/dL — ABNORMAL HIGH (ref 70–99)
Potassium: 4.2 mmol/L (ref 3.5–5.1)
Sodium: 141 mmol/L (ref 135–145)
Total Bilirubin: 0.6 mg/dL (ref 0.0–1.2)
Total Protein: 6.2 g/dL — ABNORMAL LOW (ref 6.5–8.1)

## 2024-02-04 LAB — FERRITIN: Ferritin: 46 ng/mL (ref 24–336)

## 2024-02-04 NOTE — Progress Notes (Cosign Needed)
 [facility]  [address] [place],  [place]  [address] [phone]  Clinic Day:  02/07/2024  Referring physician: Magdaline Debby HERO, MD   HISTORY OF PRESENT ILLNESS:  The patient is a 73 y.o. 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 He receives phlebotomies to keep his iron parameters under some semblance of control. Ultimately, the goal is to keep his ferritin less than 50 and his other iron parameters relatively stable. He comes in today for routine follow-up. Since his last visit, the patient has been doing well. He denies having any myalgias or other systemic symptoms which concern him for complications related to his underlying hemochromatosis.    VITALS:   Blood pressure (!) 136/93, pulse 69, temperature 98.1 F (36.7 C), temperature source Oral, resp. rate 18, height 6' (1.829 m), weight 201 lb 9.6 oz (91.4 kg), SpO2 97%. Wt Readings from Last 3 Encounters:  02/07/24 201 lb 9.6 oz (91.4 kg)  10/07/23 206 lb (93.4 kg)  07/30/23 207 lb 12.8 oz (94.3 kg)   Body mass index is 27.34 kg/m.  Performance status (ECOG): 0 - Asymptomatic  PHYSICAL EXAM:   Physical Exam Vitals and nursing note reviewed.  Constitutional:      General: He is not in acute distress.    Appearance: Normal appearance. He is normal weight.  HENT:     Head: Normocephalic and atraumatic.     Mouth/Throat:     Mouth: Mucous membranes are moist.     Pharynx: Oropharynx is clear. No oropharyngeal exudate or posterior oropharyngeal erythema.  Eyes:     General: No scleral icterus.    Extraocular Movements: Extraocular movements intact.     Conjunctiva/sclera: Conjunctivae normal.     Pupils: Pupils are equal, round, and reactive to light.  Cardiovascular:     Rate and Rhythm: Normal rate and regular rhythm.     Heart sounds: Normal heart sounds. No murmur heard.    No friction rub. No gallop.  Pulmonary:     Effort: Pulmonary effort is normal.     Breath sounds: Normal breath  sounds. No wheezing, rhonchi or rales.  Abdominal:     General: Bowel sounds are normal. There is no distension.     Palpations: Abdomen is soft. There is no hepatomegaly, splenomegaly or mass.     Tenderness: There is no abdominal tenderness.  Musculoskeletal:        General: Normal range of motion.     Cervical back: Normal range of motion and neck supple. No tenderness.     Right lower leg: No edema.     Left lower leg: No edema.  Lymphadenopathy:     Cervical: No cervical adenopathy.     Upper Body:     Right upper body: No supraclavicular or axillary adenopathy.     Left upper body: No supraclavicular or axillary adenopathy.     Lower Body: No right inguinal adenopathy. No left inguinal adenopathy.  Skin:    General: Skin is warm and dry.     Coloration: Skin is not jaundiced.     Findings: No rash.  Neurological:     Mental Status: He is alert and oriented to person, place, and time.     Cranial Nerves: No cranial nerve deficit.  Psychiatric:        Mood and Affect: Mood normal.        Behavior: Behavior normal.        Thought Content: Thought content normal.  LABS:      Latest Ref Rng & Units 02/04/2024    9:34 AM 10/06/2023    9:14 AM 06/07/2023    2:01 PM  CBC  WBC 4.0 - 10.5 K/uL 4.5  3.7  5.1   Hemoglobin 13.0 - 17.0 g/dL 84.7  83.9  83.6   Hematocrit 39.0 - 52.0 % 41.4  42.9  44.7   Platelets 150 - 400 K/uL 185  192  207       Latest Ref Rng & Units 02/04/2024    9:34 AM 10/06/2023    9:14 AM 09/02/2023   10:18 AM  CMP  Glucose 70 - 99 mg/dL 818  779  799   BUN 8 - 23 mg/dL 21  16  15    Creatinine 0.61 - 1.24 mg/dL 8.95  8.90  8.99   Sodium 135 - 145 mmol/L 141  139  141   Potassium 3.5 - 5.1 mmol/L 4.2  4.0  4.2   Chloride 98 - 111 mmol/L 107  105  103   CO2 22 - 32 mmol/L 20  23  21    Calcium  8.9 - 10.3 mg/dL 9.0  9.0  9.1   Total Protein 6.5 - 8.1 g/dL 6.2  6.2    Total Bilirubin 0.0 - 1.2 mg/dL 0.6  0.8    Alkaline Phos 38 - 126 U/L 104  101     AST 15 - 41 U/L 33  43    ALT 0 - 44 U/L 34  34      Lab Results  Component Value Date   TIBC 276 02/04/2024   TIBC 288 10/06/2023   TIBC 291 06/07/2023   FERRITIN 46 02/04/2024   FERRITIN 38 10/06/2023   FERRITIN 40 06/07/2023   IRONPCTSAT 53 (H) 02/04/2024   IRONPCTSAT 53 (H) 10/06/2023   IRONPCTSAT 54 (H) 06/07/2023    Review Flowsheet  More data exists      Latest Ref Rng & Units 06/07/2023 10/06/2023 02/04/2024  Oncology Labs  Ferritin 24 - 336 ng/mL 40  38  46   %SAT 17.9 - 39.5 % 54  53  53      STUDIES:   No results found.    ASSESSMENT & PLAN:   Assessment/Plan:  73 y.o. 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  When evaluating his most recent iron parameters, we are pleased as his ferritin is below 50.  As his serum iron and iron saturation remain elevated despite phlebotomy every 2 months, he will be phlebotomized today and every 4 weeks for total of 3 times.  We will see him back in 3 months to reassess his iron parameters as it pertains to his underlying hemochromatosis.  The patient understands all the plans discussed today and is in agreement with them.  He knows to contact our office if he develops concerns prior to his next appointment.     Andrez DELENA Foy, PA-C   Physician Assistant [facility]  [phone]

## 2024-02-07 ENCOUNTER — Inpatient Hospital Stay: Admitting: Hematology and Oncology

## 2024-02-07 ENCOUNTER — Inpatient Hospital Stay

## 2024-02-07 ENCOUNTER — Telehealth: Payer: Self-pay | Admitting: Oncology

## 2024-02-07 ENCOUNTER — Encounter: Payer: Self-pay | Admitting: Hematology and Oncology

## 2024-02-07 NOTE — Telephone Encounter (Signed)
 Patient has been scheduled for follow-up visit per 02/04/24 LOS.  Pt aware of scheduled appt details.

## 2024-02-07 NOTE — Patient Instructions (Signed)

## 2024-03-07 ENCOUNTER — Inpatient Hospital Stay: Attending: Oncology

## 2024-03-07 NOTE — Progress Notes (Signed)
 Terry Bolton presents today for phlebotomy per MD orders. Phlebotomy procedure started at 1515 and ended at 1530. 525 mL removed. Patient observed for 5 minutes after procedure without any incident. Patient tolerated procedure well. IV needle removed intact.  RIGHT AC

## 2024-03-07 NOTE — Patient Instructions (Signed)

## 2024-04-04 ENCOUNTER — Inpatient Hospital Stay

## 2024-04-04 NOTE — Progress Notes (Signed)
 Terry Bolton presents today for phlebotomy per MD orders. Phlebotomy procedure started at 1515 and ended at 1530. 500 ml removed. Patient observed for 10 minutes after procedure without any incident. Patient tolerated procedure well. IV needle removed intact.

## 2024-04-28 ENCOUNTER — Other Ambulatory Visit: Payer: Self-pay | Admitting: Oncology

## 2024-04-28 ENCOUNTER — Inpatient Hospital Stay: Attending: Oncology

## 2024-04-28 DIAGNOSIS — Z79899 Other long term (current) drug therapy: Secondary | ICD-10-CM | POA: Diagnosis not present

## 2024-04-28 LAB — CBC WITH DIFFERENTIAL (CANCER CENTER ONLY)
Abs Immature Granulocytes: 0 K/uL (ref 0.00–0.07)
Basophils Absolute: 0.1 K/uL (ref 0.0–0.1)
Basophils Relative: 1 %
Eosinophils Absolute: 0.2 K/uL (ref 0.0–0.5)
Eosinophils Relative: 6 %
HCT: 40.5 % (ref 39.0–52.0)
Hemoglobin: 14.6 g/dL (ref 13.0–17.0)
Immature Granulocytes: 0 %
Lymphocytes Relative: 30 %
Lymphs Abs: 1.1 K/uL (ref 0.7–4.0)
MCH: 34.4 pg — ABNORMAL HIGH (ref 26.0–34.0)
MCHC: 36 g/dL (ref 30.0–36.0)
MCV: 95.3 fL (ref 80.0–100.0)
Monocytes Absolute: 0.4 K/uL (ref 0.1–1.0)
Monocytes Relative: 10 %
Neutro Abs: 1.9 K/uL (ref 1.7–7.7)
Neutrophils Relative %: 53 %
Platelet Count: 199 K/uL (ref 150–400)
RBC: 4.25 MIL/uL (ref 4.22–5.81)
RDW: 11.6 % (ref 11.5–15.5)
WBC Count: 3.6 K/uL — ABNORMAL LOW (ref 4.0–10.5)
nRBC: 0 % (ref 0.0–0.2)

## 2024-04-28 LAB — CMP (CANCER CENTER ONLY)
ALT: 38 U/L (ref 0–44)
AST: 42 U/L — ABNORMAL HIGH (ref 15–41)
Albumin: 4.2 g/dL (ref 3.5–5.0)
Alkaline Phosphatase: 107 U/L (ref 38–126)
Anion gap: 10 (ref 5–15)
BUN: 14 mg/dL (ref 8–23)
CO2: 24 mmol/L (ref 22–32)
Calcium: 9 mg/dL (ref 8.9–10.3)
Chloride: 105 mmol/L (ref 98–111)
Creatinine: 0.99 mg/dL (ref 0.61–1.24)
GFR, Estimated: >60 mL/min (ref >60)
Glucose, Bld: 168 mg/dL — ABNORMAL HIGH (ref 70–99)
Potassium: 4.3 mmol/L (ref 3.5–5.1)
Sodium: 139 mmol/L (ref 135–145)
Total Bilirubin: 0.5 mg/dL (ref 0.0–1.2)
Total Protein: 6.4 g/dL — ABNORMAL LOW (ref 6.5–8.1)

## 2024-04-28 LAB — IRON AND TIBC
Iron: 92 ug/dL (ref 45–182)
Saturation Ratios: 34 % (ref 17.9–39.5)
TIBC: 272 ug/dL (ref 250–450)
UIBC: 180 ug/dL

## 2024-04-28 LAB — FERRITIN: Ferritin: 80 ng/mL (ref 24–336)

## 2024-04-30 NOTE — Progress Notes (Unsigned)
 [facility]  [address] [place],  [place]  [address] [phone]  Clinic Day:  10/07/2022  Referring physician: Magdaline Debby HERO, MD  HISTORY OF PRESENT ILLNESS:  The patient is a 73 y.o. 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  He receives phlebotomies to keep his iron parameters under some semblance of control.  Ultimately, the goal is to keep his ferritin less than 50 and his other iron parameters relatively stable.  He comes in today for routine follow-up.  Since his last visit, the patient has been doing well.  He denies having any myalgias or other systemic symptoms which concern him for complications related to his underlying hemochromatosis.    PHYSICAL EXAM:  There were no vitals taken for this visit. Wt Readings from Last 3 Encounters:  02/07/24 201 lb 9.6 oz (91.4 kg)  10/07/23 206 lb (93.4 kg)  07/30/23 207 lb 12.8 oz (94.3 kg)   There is no height or weight on file to calculate BMI. Performance status (ECOG): 0 - Asymptomatic Physical Exam Constitutional:      Appearance: Normal appearance. He is not ill-appearing.  HENT:     Mouth/Throat:     Mouth: Mucous membranes are moist.     Pharynx: Oropharynx is clear. No oropharyngeal exudate or posterior oropharyngeal erythema.  Cardiovascular:     Rate and Rhythm: Regular rhythm.     Heart sounds: No murmur heard.    No friction rub. No gallop.  Pulmonary:     Effort: Pulmonary effort is normal. No respiratory distress.     Breath sounds: Normal breath sounds. No wheezing, rhonchi or rales.  Abdominal:     General: Bowel sounds are normal. There is no distension.     Palpations: Abdomen is soft. There is no mass.     Tenderness: There is no abdominal tenderness.  Musculoskeletal:        General: No swelling.     Right lower leg: No edema.     Left lower leg: No edema.  Lymphadenopathy:     Cervical: No cervical adenopathy.     Upper Body:     Right upper body: No  supraclavicular or axillary adenopathy.     Left upper body: No supraclavicular or axillary adenopathy.     Lower Body: No right inguinal adenopathy. No left inguinal adenopathy.  Skin:    General: Skin is warm.     Coloration: Skin is not jaundiced.     Findings: No lesion or rash.  Neurological:     General: No focal deficit present.     Mental Status: He is alert and oriented to person, place, and time. Mental status is at baseline.  Psychiatric:        Mood and Affect: Mood normal.        Behavior: Behavior normal.        Thought Content: Thought content normal.    LABS:      Latest Ref Rng & Units 04/28/2024    9:49 AM 02/04/2024    9:34 AM 10/06/2023    9:14 AM  CBC  WBC 4.0 - 10.5 K/uL 3.6  4.5  3.7   Hemoglobin 13.0 - 17.0 g/dL 85.3  84.7  83.9   Hematocrit 39.0 - 52.0 % 40.5  41.4  42.9   Platelets 150 - 400 K/uL 199  185  192       Latest Ref Rng & Units 04/28/2024    9:49 AM 02/04/2024    9:34  AM 10/06/2023    9:14 AM  CMP  Glucose 70 - 99 mg/dL 831  818  779   BUN 8 - 23 mg/dL 14  21  16    Creatinine 0.61 - 1.24 mg/dL 9.00  8.95  8.90   Sodium 135 - 145 mmol/L 139  141  139   Potassium 3.5 - 5.1 mmol/L 4.3  4.2  4.0   Chloride 98 - 111 mmol/L 105  107  105   CO2 22 - 32 mmol/L 24  20  23    Calcium  8.9 - 10.3 mg/dL 9.0  9.0  9.0   Total Protein 6.5 - 8.1 g/dL 6.4  6.2  6.2   Total Bilirubin 0.0 - 1.2 mg/dL 0.5  0.6  0.8   Alkaline Phos 38 - 126 U/L 107  104  101   AST 15 - 41 U/L 42  33  43   ALT 0 - 44 U/L 38  34  34     Latest Reference Range & Units 04/28/24 09:49  Iron 45 - 182 ug/dL 92  UIBC ug/dL 819  TIBC 749 - 549 ug/dL 727  Saturation Ratios 17.9 - 39.5 % 34  Ferritin 24 - 336 ng/mL 80    ASSESSMENT & PLAN:  Assessment/Plan:  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  When evaluating his most recent iron parameters, I am pleased as his ferritin is below 50.  As his serum iron and iron saturation are somewhat elevated, he will be  phlebotomized today and in 2 months.  I will see him back in 4 months to reassess his iron parameters as it pertains to his underlying hemochromatosis.  The patient understands all the plans discussed today and is in agreement with them.    Jermery Caratachea DELENA Kerns, MD

## 2024-05-01 ENCOUNTER — Other Ambulatory Visit

## 2024-05-01 ENCOUNTER — Inpatient Hospital Stay

## 2024-05-01 ENCOUNTER — Inpatient Hospital Stay: Admitting: Oncology

## 2024-05-01 NOTE — Patient Instructions (Signed)

## 2024-05-01 NOTE — Progress Notes (Signed)
 Terry Bolton presents today for phlebotomy per MD orders. Phlebotomy procedure started at 1517 and ended at 1527. 500 grams removed. Patient observed for 30 minutes after procedure without any incident. Patient tolerated procedure well. IV needle removed intact.

## 2024-06-26 ENCOUNTER — Inpatient Hospital Stay: Attending: Oncology

## 2024-06-26 NOTE — Progress Notes (Signed)
 Terry Bolton presents today for phlebotomy per MD orders. Phlebotomy procedure started at 1510 and ended at 1520. 500 grams removed. Patient observed for 30 minutes after procedure without any incident. Patient tolerated procedure well. IV needle removed intact.

## 2024-06-26 NOTE — Patient Instructions (Signed)

## 2024-08-31 ENCOUNTER — Inpatient Hospital Stay

## 2024-09-01 ENCOUNTER — Inpatient Hospital Stay

## 2024-09-01 ENCOUNTER — Inpatient Hospital Stay: Admitting: Oncology
